# Patient Record
Sex: Female | Born: 1955 | Race: White | Hispanic: No | Marital: Married | Smoking: Never smoker
Health system: Southern US, Community
[De-identification: ages and names within clinical notes are randomized; demographics above are authoritative.]

## PROBLEM LIST (undated history)

## (undated) DIAGNOSIS — I10 Essential (primary) hypertension: Secondary | ICD-10-CM

---

## 2019-01-30 ENCOUNTER — Ambulatory Visit: Payer: Self-pay

## 2019-02-14 ENCOUNTER — Ambulatory Visit: Payer: Self-pay

## 2019-07-16 ENCOUNTER — Observation Stay (HOSPITAL_BASED_OUTPATIENT_CLINIC_OR_DEPARTMENT_OTHER)
Admission: EM | Admit: 2019-07-16 | Discharge: 2019-07-17 | Disposition: A | Discharge status: Home / Self Care | Payer: Medicare PPO | Attending: Internal Medicine | Admitting: Internal Medicine

## 2019-07-16 ENCOUNTER — Encounter (HOSPITAL_BASED_OUTPATIENT_CLINIC_OR_DEPARTMENT_OTHER): Payer: Self-pay

## 2019-07-16 ENCOUNTER — Emergency Department (HOSPITAL_BASED_OUTPATIENT_CLINIC_OR_DEPARTMENT_OTHER): Payer: Medicare PPO

## 2019-07-16 ENCOUNTER — Other Ambulatory Visit: Payer: Self-pay

## 2019-07-16 DIAGNOSIS — R410 Disorientation, unspecified: Secondary | ICD-10-CM

## 2019-07-16 DIAGNOSIS — Z79899 Other long term (current) drug therapy: Secondary | ICD-10-CM | POA: Diagnosis not present

## 2019-07-16 DIAGNOSIS — R29700 NIHSS score 0: Secondary | ICD-10-CM | POA: Insufficient documentation

## 2019-07-16 DIAGNOSIS — R4182 Altered mental status, unspecified: Secondary | ICD-10-CM | POA: Diagnosis present

## 2019-07-16 DIAGNOSIS — R001 Bradycardia, unspecified: Secondary | ICD-10-CM | POA: Diagnosis not present

## 2019-07-16 DIAGNOSIS — Z20822 Contact with and (suspected) exposure to covid-19: Secondary | ICD-10-CM | POA: Diagnosis not present

## 2019-07-16 DIAGNOSIS — I639 Cerebral infarction, unspecified: Secondary | ICD-10-CM | POA: Diagnosis not present

## 2019-07-16 DIAGNOSIS — I10 Essential (primary) hypertension: Secondary | ICD-10-CM | POA: Diagnosis not present

## 2019-07-16 DIAGNOSIS — G454 Transient global amnesia: Secondary | ICD-10-CM | POA: Diagnosis not present

## 2019-07-16 HISTORY — DX: Essential (primary) hypertension: I10

## 2019-07-16 LAB — COMPREHENSIVE METABOLIC PANEL
ALT: 32 U/L (ref 0–44)
AST: 27 U/L (ref 15–41)
Albumin: 4.2 g/dL (ref 3.5–5.0)
Alkaline Phosphatase: 60 U/L (ref 38–126)
Anion gap: 11 (ref 5–15)
BUN: 15 mg/dL (ref 8–23)
CO2: 27 mmol/L (ref 22–32)
Calcium: 9.1 mg/dL (ref 8.9–10.3)
Chloride: 101 mmol/L (ref 98–111)
Creatinine, Ser: 0.84 mg/dL (ref 0.44–1.00)
GFR calc Af Amer: >60 mL/min (ref >60)
GFR calc non Af Amer: >60 mL/min (ref >60)
Glucose, Bld: 100 mg/dL — ABNORMAL HIGH (ref 70–99)
Potassium: 3.6 mmol/L (ref 3.5–5.1)
Sodium: 139 mmol/L (ref 135–145)
Total Bilirubin: 0.8 mg/dL (ref 0.3–1.2)
Total Protein: 6.9 g/dL (ref 6.5–8.1)

## 2019-07-16 LAB — DIFFERENTIAL
Abs Immature Granulocytes: 0.01 10*3/uL (ref 0.00–0.07)
Basophils Absolute: 0 10*3/uL (ref 0.0–0.1)
Basophils Relative: 1 %
Eosinophils Absolute: 0 10*3/uL (ref 0.0–0.5)
Eosinophils Relative: 1 %
Immature Granulocytes: 0 %
Lymphocytes Relative: 28 %
Lymphs Abs: 1 10*3/uL (ref 0.7–4.0)
Monocytes Absolute: 0.3 10*3/uL (ref 0.1–1.0)
Monocytes Relative: 8 %
Neutro Abs: 2.2 10*3/uL (ref 1.7–7.7)
Neutrophils Relative %: 62 %

## 2019-07-16 LAB — SARS CORONAVIRUS 2 BY RT PCR (HOSPITAL ORDER, PERFORMED IN ~~LOC~~ HOSPITAL LAB): SARS Coronavirus 2: NEGATIVE

## 2019-07-16 LAB — CBC
HCT: 45.3 % (ref 36.0–46.0)
Hemoglobin: 14.5 g/dL (ref 12.0–15.0)
MCH: 30 pg (ref 26.0–34.0)
MCHC: 32 g/dL (ref 30.0–36.0)
MCV: 93.6 fL (ref 80.0–100.0)
Platelets: 165 10*3/uL (ref 150–400)
RBC: 4.84 MIL/uL (ref 3.87–5.11)
RDW: 13.3 % (ref 11.5–15.5)
WBC: 3.5 10*3/uL — ABNORMAL LOW (ref 4.0–10.5)
nRBC: 0 % (ref 0.0–0.2)

## 2019-07-16 LAB — URINALYSIS, ROUTINE W REFLEX MICROSCOPIC
Bilirubin Urine: NEGATIVE
Glucose, UA: NEGATIVE mg/dL
Hgb urine dipstick: NEGATIVE
Ketones, ur: NEGATIVE mg/dL
Leukocytes,Ua: NEGATIVE
Nitrite: NEGATIVE
Protein, ur: NEGATIVE mg/dL
Specific Gravity, Urine: <1.005 — ABNORMAL LOW (ref 1.005–1.030)
pH: 6 (ref 5.0–8.0)

## 2019-07-16 LAB — RAPID URINE DRUG SCREEN, HOSP PERFORMED
Amphetamines: NOT DETECTED
Barbiturates: NOT DETECTED
Benzodiazepines: NOT DETECTED
Cocaine: NOT DETECTED
Opiates: NOT DETECTED
Tetrahydrocannabinol: NOT DETECTED

## 2019-07-16 LAB — PROTIME-INR
INR: 1 (ref 0.8–1.2)
Prothrombin Time: 12.5 seconds (ref 11.4–15.2)

## 2019-07-16 LAB — APTT: aPTT: 26 seconds (ref 24–36)

## 2019-07-16 LAB — ETHANOL: Alcohol, Ethyl (B): <10 mg/dL (ref <10)

## 2019-07-16 IMAGING — CT CT HEAD W/O CM
3 series · 15 of 45 positions shown, 18 images · non-contrast
Comparison: None.

CLINICAL DATA: Confusion

EXAM:
CT HEAD WITHOUT CONTRAST
TECHNIQUE: Contiguous axial images were obtained from the base of the skull
through the vertex without intravenous contrast.

[Series 3: head wo · axial · 0.42mm/px · z∈[-174,-59]mm · 9 of 28 slices shown, 12 images]
[im 3/28  brain]
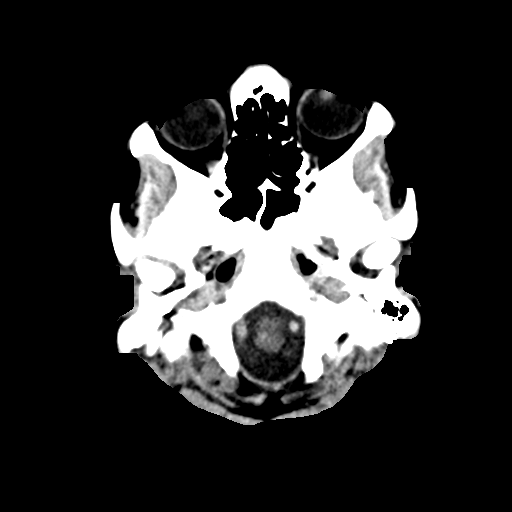
[im 3/28  bone]
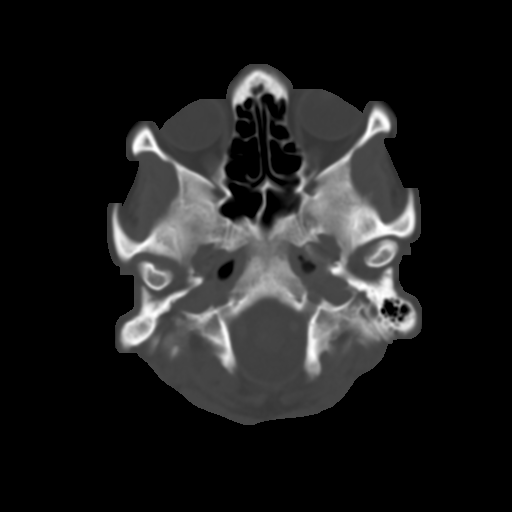
[im 6/28  brain]
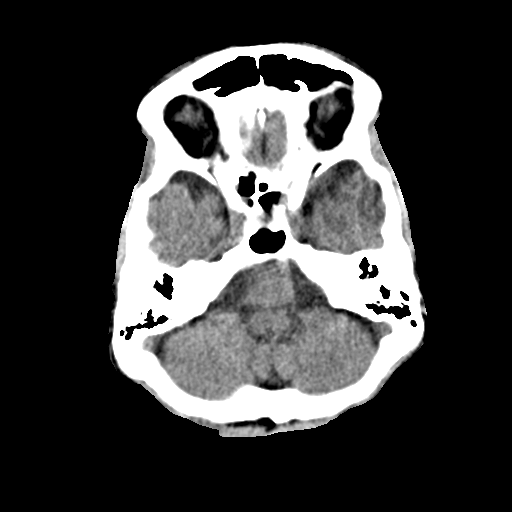
[im 9/28  brain]
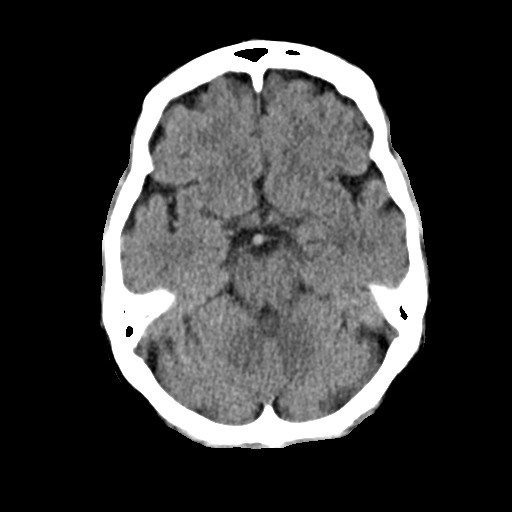
[im 12/28  brain]
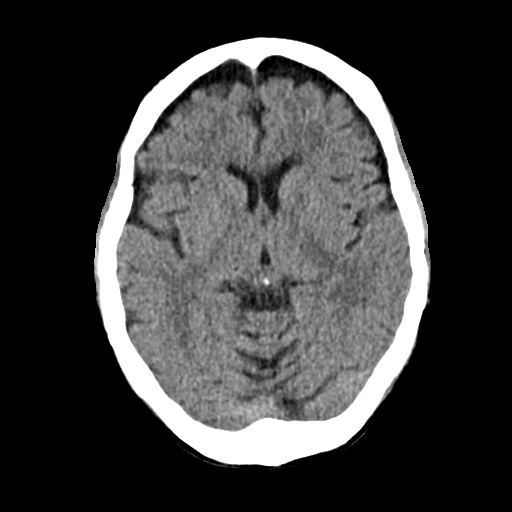
[im 15/28  brain]
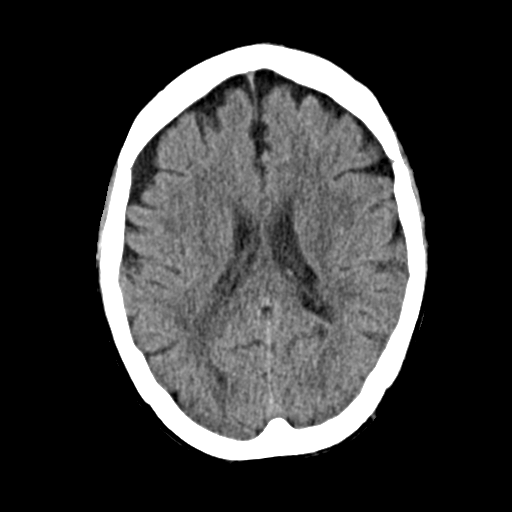
[im 15/28  bone]
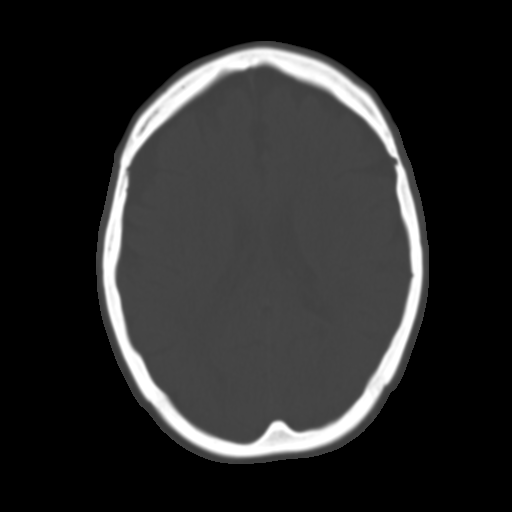
[im 17/28  brain]
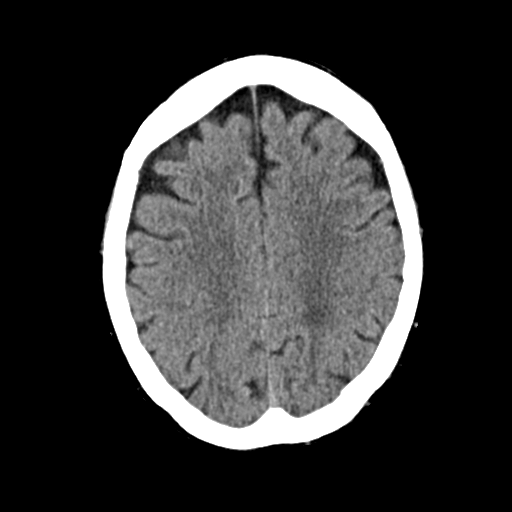
[im 20/28  brain]
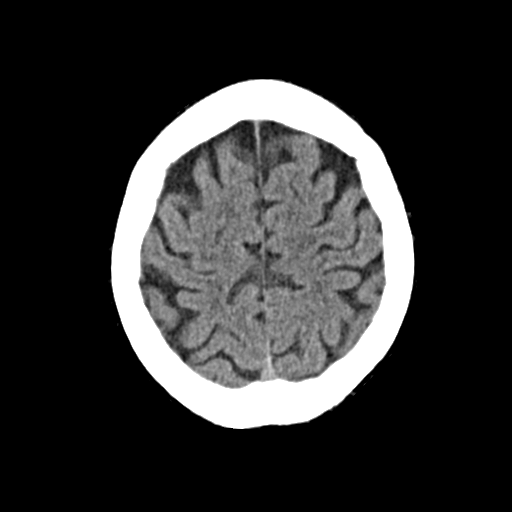
[im 23/28  brain]
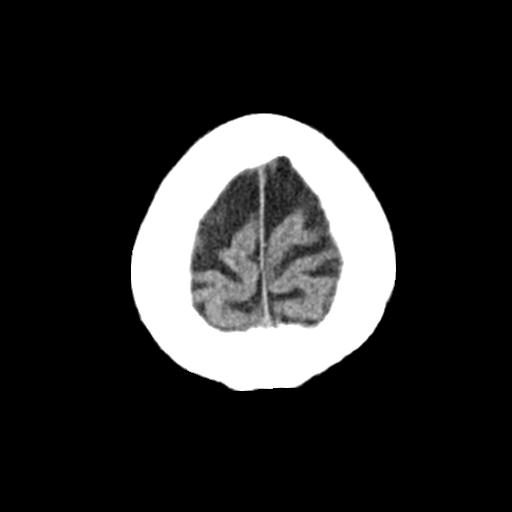
[im 26/28  brain]
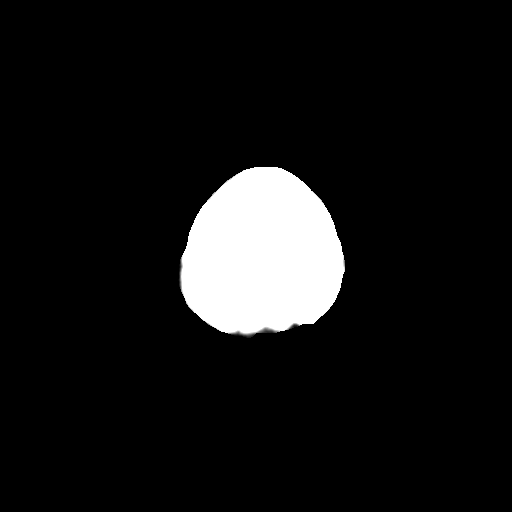
[im 26/28  bone]
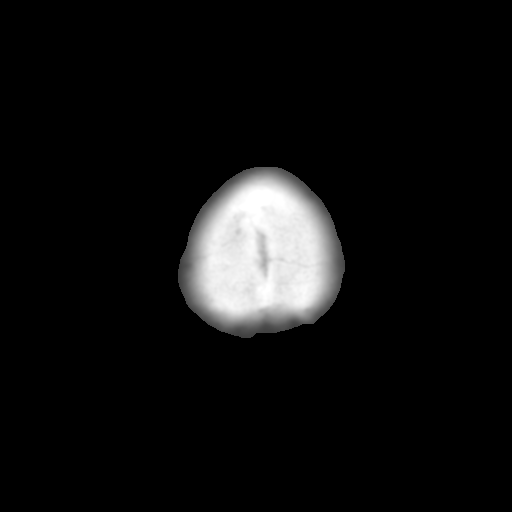

[Series 4: coronal soft · coronal · 0.29mm/px · 3 of 59 slices shown]
[im 20/59  brain]
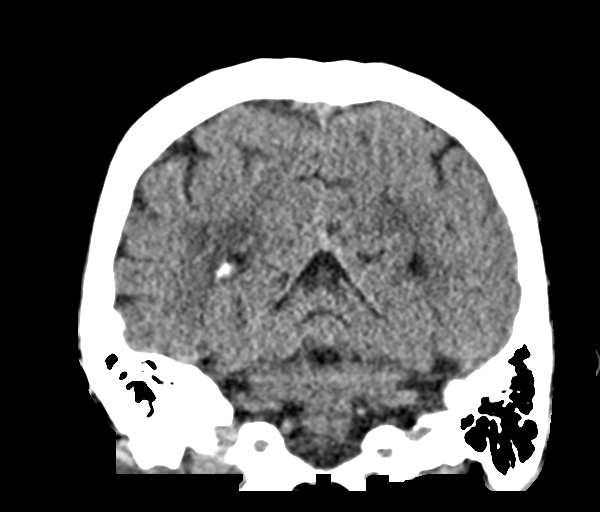
[im 26/59  brain]
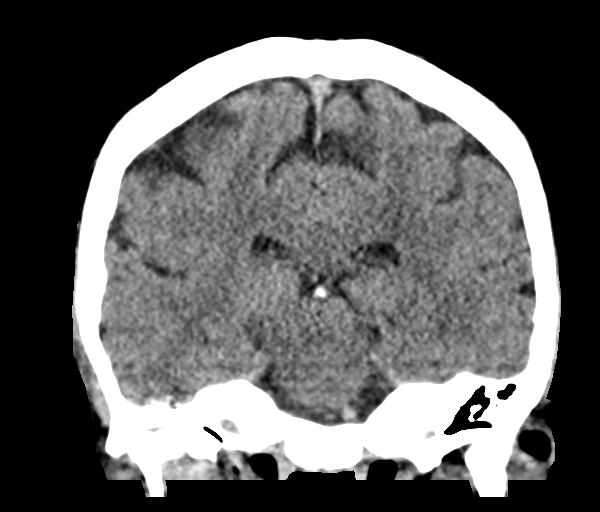
[im 33/59  brain]
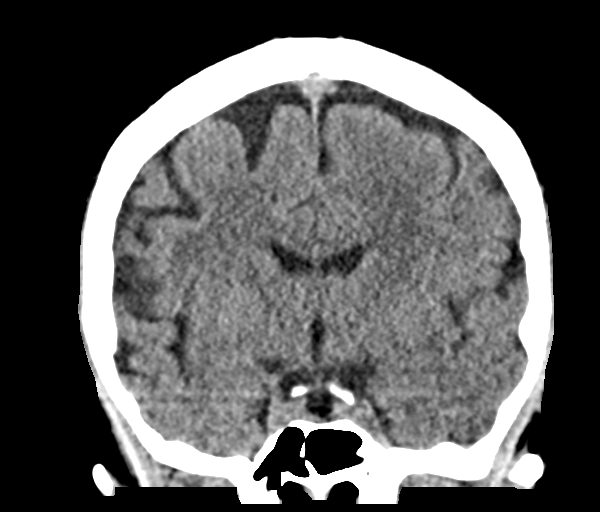

[Series 5: sag soft · sagittal · 0.28mm/px · 3 of 48 slices shown]
[im 16/48  brain]
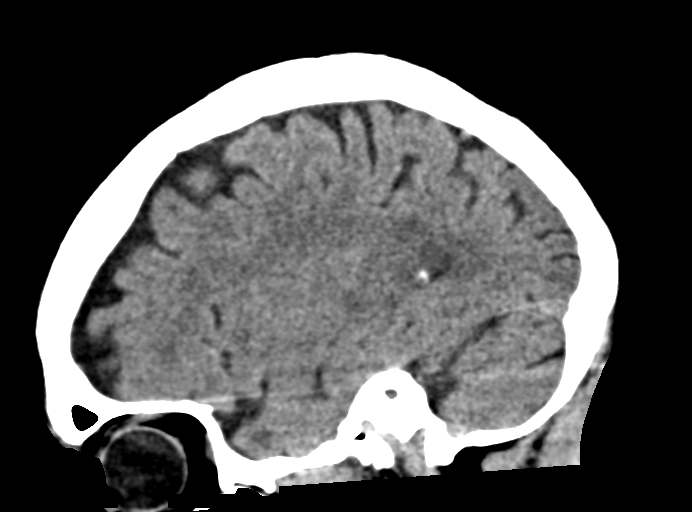
[im 24/48  brain]
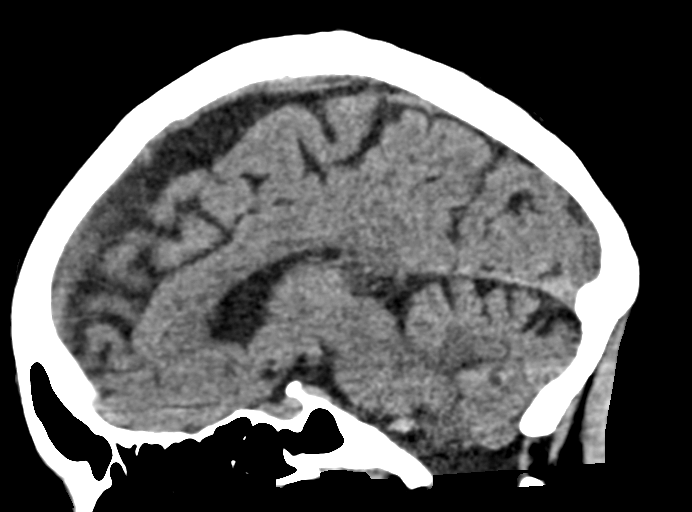
[im 32/48  brain]
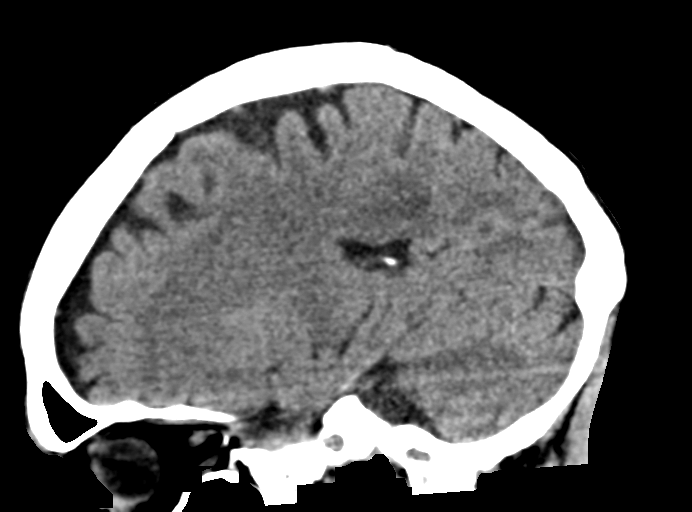

[15 of 45 positions shown; findings below may reference images not displayed]

FINDINGS: Brain: Mild diffuse atrophy. No evidence of acute infarction,
hemorrhage, hydrocephalus, extra-axial collection or mass
lesion/mass effect.

Vascular: No hyperdense vessel or unexpected calcification.

Skull: Normal. Negative for fracture or focal lesion.

Sinuses/Orbits: No acute finding.

Other: None.
IMPRESSION: 1. Negative for bleed or other acute intracranial process.
2. Mild diffuse atrophy.

## 2019-07-16 MED ORDER — SODIUM CHLORIDE 0.9 % IV BOLUS
500.0000 mL | Freq: Once | INTRAVENOUS | Status: AC
  Administered 2019-07-16: 500 mL via INTRAVENOUS

## 2019-07-16 MED ORDER — SODIUM CHLORIDE 0.9 % IV SOLN
100.0000 mL/h | INTRAVENOUS | Status: DC
  Administered 2019-07-16: 100 mL/h via INTRAVENOUS

## 2019-07-16 NOTE — ED Notes (Signed)
Pt and husband had food delivered to ED; food brought to pt's room by EMT

## 2019-07-16 NOTE — ED Notes (Signed)
Graham crackers and Diet Coke provided to pt

## 2019-07-16 NOTE — ED Notes (Signed)
ED Provider at bedside. 

## 2019-07-16 NOTE — ED Notes (Signed)
Unable to draw blood with IV start.

## 2019-07-16 NOTE — ED Notes (Signed)
Patient transported to CT 

## 2019-07-16 NOTE — ED Triage Notes (Signed)
Pt arrives with husband who reports that patient woke up this morning around 6 am, went through her normal routine and then had a sudden onset of confusion. Per husband she did not remember laying some clothes out, or remember placing  certain things around the room. He states she seemed to be taking longer to process things when he was speaking with her. Denies any gait issues, slurred speech, numbness, headaches, or vision changes.

## 2019-07-16 NOTE — Consult Note (Signed)
[facility] Tele[facility] Consult Services  Stat Consult  Date of Service:   07/16/2019 09:23:38  Impression:       G45.4 - Transient global amnesia  Comments/Sign-Out: Amnestic/confusional spell. Suspect transient global amnesia. Ddx includes stroke, seizure, TIA. Recommend MRI brain, MRA head and neck, TTE, EEG. ASA 81 mg recommended.  CT HEAD: Showed No Acute Hemorrhage or Acute Core Infarct Reviewed  Metrics: [facility] Notification Time: 07/16/2019 09:18:32 Stamp Time: 07/16/2019 09:23:38 Callback Response Time: 07/16/2019 09:28:10  Our recommendations are outlined below.  Recommendations:       Initiate Aspirin 81 MG Daily EEG, MRI brain, MRA head and neck.   Imaging Studies:       MRI Head with and Without Contrast       MRA Head and Neck Without Contrast When Available - Stroke Protocol  Disposition: Neurology Follow Up Recommended  Sign Out:       Discussed with Emergency Department Provider  ----------------------------------------------------------------------------------------------------  Chief Complaint: confusion  History of Present Illness: Patient is a 64 year old Female.  The patient awoke feeling normal. She was able to exercise on the eliptical without issue. However afterwards she did not remember things she did this morning and was confused about where she placed things. She did not remember busying shoes three days ago. She had no speech changes, though her husband states that she was "staring". She has had resolution of symptoms. Total duration 15-20 mins. She has not had prior similar episodes. No history of seizures. She denies new medications.   Past Medical History:      Hypertension      There is NO history of Diabetes Mellitus      There is NO history of Hyperlipidemia      There is NO history of Atrial Fibrillation      There is NO history of Coronary Artery Disease      There is NO history of  Stroke  Anticoagulant use:  No  Antiplatelet use: No    Examination: BP(133/86), Pulse(50), Blood Glucose(pending) 1A: Level of Consciousness - Alert; keenly responsive + 0 1B: Ask Month and Age - Both Questions Right + 0 1C: Blink Eyes & Squeeze Hands - Performs Both Tasks + 0 2: Test Horizontal Extraocular Movements - Normal + 0 3: Test Visual Fields - No Visual Loss + 0 4: Test Facial Palsy (Use Grimace if Obtunded) - Normal symmetry + 0 5A: Test Left Arm Motor Drift - No Drift for 10 Seconds + 0 5B: Test Right Arm Motor Drift - No Drift for 10 Seconds + 0 6A: Test Left Leg Motor Drift - No Drift for 5 Seconds + 0 6B: Test Right Leg Motor Drift - No Drift for 5 Seconds + 0 7: Test Limb Ataxia (FNF/Heel-Shin) - No Ataxia + 0 8: Test Sensation - Normal; No sensory loss + 0 9: Test Language/Aphasia - Normal; No aphasia + 0 10: Test Dysarthria - Normal + 0 11: Test Extinction/Inattention - No abnormality + 0  NIHSS Score: 0    Patient is being evaluated for possible acute neurologic impairment and high probability of imminent or life-threatening deterioration. I spent total of 20 minutes providing care to this patient, including time for face to face visit via telemedicine, review of medical records, imaging studies and discussion of findings with providers, the patient and/or family.   Dr Rutherford Guys   [facility] [phone]  Case [id number]

## 2019-07-16 NOTE — ED Notes (Signed)
Pt ambulated around unit w/o difficulty.

## 2019-07-16 NOTE — ED Notes (Signed)
Per husband, pt is back to baseline upon arrival to ED.

## 2019-07-16 NOTE — ED Provider Notes (Signed)
[facility] EMERGENCY DEPARTMENT Provider Note   CSN: [id number] Arrival date & time: 07/16/19  0846     History Chief Complaint  Patient presents with  . Altered Mental Status    Brianna Mccoy is a 64 y.o. female.  HPI   Pt presents to the ED for evaluation of acute confusion.  History was provided by the patient as well as her husband.  He states she woke up this morning her usual self.  Patient typically exercises in the morning and she had set out her clothing.  Husband states the patient then called him up to the room.  She asked him who put her clothing out.  The husband stated that he told her that she had done it but the patient did not remember that.  She then asked him about some shoes and asked to use those were.  Husband stated those were her shoes.  She did not believe him and asked him if he had a receipt.  The patient had a few more statements similar to that where she did not recognize items that were hers.  This lasted about 10 to 15 minutes.  Husband obviously started to become concerned.  Patient does not remember that episode right now but she is feeling better.  Husband states she does seem to be acting more appropriately now.  She never had any issues with headache.  She did not have any trouble with numbness or weakness or trouble with her vision.  Her speech was clear at all times.  Past Medical History:  Diagnosis Date  . Hypertension     There are no problems to display for this patient.   History reviewed. No pertinent surgical history.   OB History   No obstetric history on file.     No family history on file.  Social History   Tobacco Use  . Smoking status: Never Smoker  . Smokeless tobacco: Never Used  Substance Use Topics  . Alcohol use: Yes    Comment: socail  . Drug use: Never  Patient does exercise regularly  Home Medications Prior to Admission medications   Medication Sig Start Date End Date Taking? Authorizing Provider   estradiol-norethindrone (ACTIVELLA) 1-0.5 MG tablet Take 1 tablet by mouth daily. 08/07/16  Yes [provider]  polyethylene glycol powder (GLYCOLAX/MIRALAX) 17 GM/SCOOP powder every other day. 01/05/18  Yes [provider]  triamterene-hydrochlorothiazide (MAXZIDE-25) 37.5-25 MG tablet Take 1 tablet by mouth daily. 08/07/16  Yes [provider]  Calcium Carbonate-Vitamin D 600-200 MG-UNIT TABS Take 1 tablet by mouth daily.    [provider]  triamterene-hydrochlorothiazide (MAXZIDE-25) 37.5-25 MG tablet Take 1 tablet by mouth daily. 05/05/19   [provider]    Allergies    Patient has no known allergies.  Review of Systems   Review of Systems  All other systems reviewed and are negative.   Physical Exam Updated Vital Signs BP 133/86 (BP Location: Right Arm)   Pulse (!) 50   Temp 98.2 F (36.8 C) (Oral)   Resp 20   Ht 1.626 m (5\' 4" )   Wt 48.5 kg   SpO2 100%   BMI 18.37 kg/m   Physical Exam Vitals and nursing note reviewed.  Constitutional:      General: She is not in acute distress.    Appearance: She is well-developed and underweight.  HENT:     Head: Normocephalic and atraumatic.     Right Ear: External ear normal.  Left Ear: External ear normal.  Eyes:     General: No scleral icterus.       Right eye: No discharge.        Left eye: No discharge.     Conjunctiva/sclera: Conjunctivae normal.  Neck:     Trachea: No tracheal deviation.  Cardiovascular:     Rate and Rhythm: Normal rate and regular rhythm.  Pulmonary:     Effort: Pulmonary effort is normal. No respiratory distress.     Breath sounds: Normal breath sounds. No stridor. No wheezing or rales.  Abdominal:     General: Bowel sounds are normal. There is no distension.     Palpations: Abdomen is soft.     Tenderness: There is no abdominal tenderness. There is no guarding or rebound.  Musculoskeletal:        General: No tenderness.     Cervical back: Neck  supple.  Skin:    General: Skin is warm and dry.     Findings: No rash.  Neurological:     Mental Status: She is alert and oriented to person, place, and time.     Cranial Nerves: No cranial nerve deficit (No facial droop, extraocular movements intact, tongue midline ).     Sensory: No sensory deficit.     Motor: No abnormal muscle tone or seizure activity.     Coordination: Coordination normal.     Comments: No pronator drift bilateral upper extrem, able to hold both legs off bed for 5 seconds, sensation intact in all extremities, no visual field cuts, no left or right sided neglect, normal finger-nose exam bilaterally, no nystagmus noted  Patient does know the date, her birthdate and her location.  She knows this is a medical facility but she said this was [facility] [address].     ED Results / Procedures / Treatments   Labs (all labs ordered are listed, but only abnormal results are displayed) Labs Reviewed  CBC - Abnormal; Notable for the following components:      Result Value   WBC 3.5 (*)    All other components within normal limits  COMPREHENSIVE METABOLIC PANEL - Abnormal; Notable for the following components:   Glucose, Bld 100 (*)    All other components within normal limits  URINALYSIS, ROUTINE W REFLEX MICROSCOPIC - Abnormal; Notable for the following components:   Specific Gravity, Urine <1.005 (*)    All other components within normal limits  ETHANOL  PROTIME-INR  APTT  DIFFERENTIAL  RAPID URINE DRUG SCREEN, HOSP PERFORMED    EKG EKG Interpretation  Date/Time:  Sunday July 16 2019 10:39:44 EDT Ventricular Rate:  52 PR Interval:    QRS Duration: 89 QT Interval:  463 QTC Calculation: 431 R Axis:   100 Text Interpretation: Sinus rhythm Right axis deviation No old tracing to compare Confirmed by Linwood Dibbles [phone]) on 07/16/2019 10:52:09 AM   Radiology CT HEAD WO CONTRAST  Result Date: 07/16/2019 CLINICAL DATA:  Confusion EXAM: CT HEAD  WITHOUT CONTRAST TECHNIQUE: Contiguous axial images were obtained from the base of the skull through the vertex without intravenous contrast. COMPARISON:  None. FINDINGS: Brain: Mild diffuse atrophy. No evidence of acute infarction, hemorrhage, hydrocephalus, extra-axial collection or mass lesion/mass effect. Vascular: No hyperdense vessel or unexpected calcification. Skull: Normal. Negative for fracture or focal lesion. Sinuses/Orbits: No acute finding. Other: None. IMPRESSION: 1. Negative for bleed or other acute intracranial process. 2. Mild diffuse atrophy. Electronically Signed   By: Corlis Leak  M.D.   On: 07/16/2019 09:28    Procedures Procedures (including critical care time)  Medications Ordered in ED Medications  sodium chloride 0.9 % bolus 500 mL (500 mLs Intravenous New Bag/Given 07/16/19 1045)    Followed by  0.9 %  sodium chloride infusion (has no administration in time range)    ED Course  I have reviewed the triage vital signs and the nursing notes.  Pertinent labs & imaging results that were available during my care of the patient were reviewed by me and considered in my medical decision making (see chart for details).  Clinical Course as of Jul 15 1052  Sun Jul 16, 2019  0100 Case discussed with Dr. Ether Griffins, teleneurology.  Agrees most likely transient global amnesia but seizure is also a possibility.  Would recommend admission for further work-up including MRI and EEG.   [JK]  1054 Labs reviewed.  UA unremarkable.  Electrolyte panel unremarkable.  CBC shows white blood cell count slightly lower than normal but I doubt clinically significant.   [JK]  1054 CT scan of the brain does show mild diffuse atrophy   [JK]    Clinical Course User Index [JK] Linwood Dibbles, MD   MDM Rules/Calculators/A&P               NIH Stroke Scale: 0           Patient presented with acute onset of amnesia.  ED work-up reassuring.  NIH stroke scale equals 0.  Presentation concerning for the  possibility of transient global amnesia.  Teleneurology consultation was obtained.  Possibility of a seizure was also raised.  Recommendation was for admission, MRI and EEG.  I will consult the medical service at [facility] to arrange for further evaluation. Final Clinical Impression(s) / ED Diagnoses Final diagnoses:  Transient global amnesia      Linwood Dibbles, MD 07/16/19 1056

## 2019-07-16 NOTE — ED Notes (Signed)
Tele Stroke Cart at Bedside

## 2019-07-17 ENCOUNTER — Observation Stay (HOSPITAL_COMMUNITY): Payer: Medicare PPO

## 2019-07-17 ENCOUNTER — Encounter (HOSPITAL_COMMUNITY): Payer: Self-pay | Admitting: Internal Medicine

## 2019-07-17 ENCOUNTER — Observation Stay (HOSPITAL_BASED_OUTPATIENT_CLINIC_OR_DEPARTMENT_OTHER): Payer: Medicare PPO

## 2019-07-17 DIAGNOSIS — I351 Nonrheumatic aortic (valve) insufficiency: Secondary | ICD-10-CM

## 2019-07-17 DIAGNOSIS — I340 Nonrheumatic mitral (valve) insufficiency: Secondary | ICD-10-CM | POA: Diagnosis not present

## 2019-07-17 DIAGNOSIS — I10 Essential (primary) hypertension: Secondary | ICD-10-CM | POA: Diagnosis not present

## 2019-07-17 DIAGNOSIS — G454 Transient global amnesia: Secondary | ICD-10-CM

## 2019-07-17 DIAGNOSIS — I361 Nonrheumatic tricuspid (valve) insufficiency: Secondary | ICD-10-CM

## 2019-07-17 DIAGNOSIS — R569 Unspecified convulsions: Secondary | ICD-10-CM | POA: Diagnosis not present

## 2019-07-17 LAB — CBC
HCT: 41.4 % (ref 36.0–46.0)
Hemoglobin: 13.3 g/dL (ref 12.0–15.0)
MCH: 30.4 pg (ref 26.0–34.0)
MCHC: 32.1 g/dL (ref 30.0–36.0)
MCV: 94.7 fL (ref 80.0–100.0)
Platelets: 152 10*3/uL (ref 150–400)
RBC: 4.37 MIL/uL (ref 3.87–5.11)
RDW: 13.2 % (ref 11.5–15.5)
WBC: 4 10*3/uL (ref 4.0–10.5)
nRBC: 0 % (ref 0.0–0.2)

## 2019-07-17 LAB — LIPID PANEL
Cholesterol: 194 mg/dL (ref 0–200)
HDL: 90 mg/dL (ref >40)
LDL Cholesterol: 92 mg/dL (ref 0–99)
Total CHOL/HDL Ratio: 2.2 RATIO
Triglycerides: 61 mg/dL (ref <150)
VLDL: 12 mg/dL (ref 0–40)

## 2019-07-17 LAB — CREATININE, SERUM
Creatinine, Ser: 0.95 mg/dL (ref 0.44–1.00)
GFR calc Af Amer: >60 mL/min (ref >60)
GFR calc non Af Amer: >60 mL/min (ref >60)

## 2019-07-17 LAB — ECHOCARDIOGRAM COMPLETE
Height: 64 in
Weight: 1703.71 oz

## 2019-07-17 LAB — BASIC METABOLIC PANEL
Anion gap: 11 (ref 5–15)
BUN: 9 mg/dL (ref 8–23)
CO2: 24 mmol/L (ref 22–32)
Calcium: 9.3 mg/dL (ref 8.9–10.3)
Chloride: 106 mmol/L (ref 98–111)
Creatinine, Ser: 0.85 mg/dL (ref 0.44–1.00)
GFR calc Af Amer: >60 mL/min (ref >60)
GFR calc non Af Amer: >60 mL/min (ref >60)
Glucose, Bld: 93 mg/dL (ref 70–99)
Potassium: 3.8 mmol/L (ref 3.5–5.1)
Sodium: 141 mmol/L (ref 135–145)

## 2019-07-17 LAB — TSH: TSH: 5.414 u[IU]/mL — ABNORMAL HIGH (ref 0.350–4.500)

## 2019-07-17 LAB — HEMOGLOBIN A1C
Hgb A1c MFr Bld: 5.3 % (ref 4.8–5.6)
Mean Plasma Glucose: 105.41 mg/dL

## 2019-07-17 LAB — HIV ANTIBODY (ROUTINE TESTING W REFLEX): HIV Screen 4th Generation wRfx: NONREACTIVE

## 2019-07-17 IMAGING — MR MR HEAD WO/W CM
14 of 16 series · 40 of 48 positions shown · IV contrast (gadavist)
Comparison: Head CT [DATE]

CLINICAL DATA: Acute confusion.

EXAM:
MRI HEAD WITHOUT AND WITH CONTRAST
MRA HEAD WITHOUT CONTRAST
MRA NECK WITHOUT CONTRAST
TECHNIQUE: Multiplanar, multiecho pulse sequences of the brain and surrounding
structures were obtained without and with intravenous contrast.
Angiographic images of the Circle of Willis were obtained using MRA
technique without intravenous contrast. Angiographic images of the
neck were obtained using MRA technique without intravenous contrast.
Carotid stenosis measurements (when applicable) are obtained
utilizing NASCET criteria, using the distal internal carotid
diameter as the denominator.
CONTRAST:  5mL GADAVIST GADOBUTROL 1 MMOL/ML IV SOLN

[Series 5: DWI · axial · 3.0mm · 0.88mm/px · z∈[-91,+49]mm · 6 of 96 slices shown (1 of 4)]
[im 1/96]
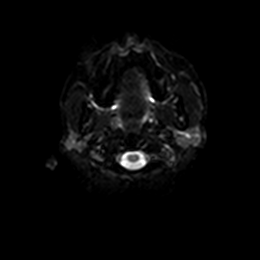
[im 20/96]
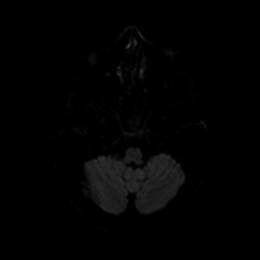
[im 39/96]
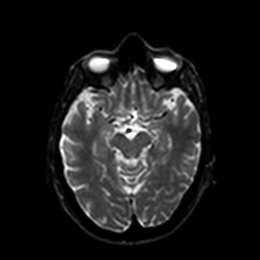
[im 58/96]
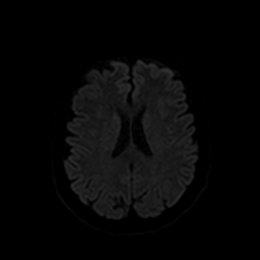
[im 77/96]
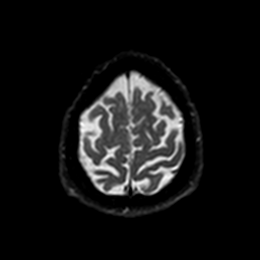
[im 96/96]
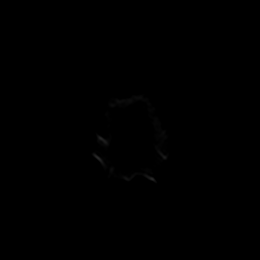

[Series 6: DWI · axial · 3.0mm · 0.88mm/px · z∈[-91,+49]mm · 2 of 48 slices shown (2 of 4)]
[im 1/48]
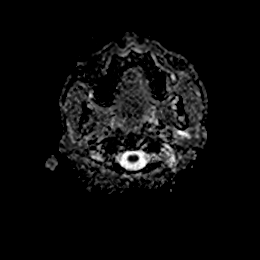
[im 48/48]
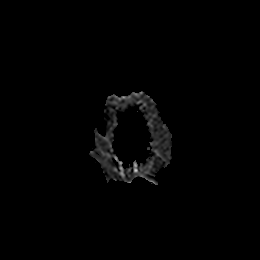

[Series 7: T1 · sagittal · 5.0mm · 0.69mm/px · 1 of 25 slices shown]
[im 1/25]
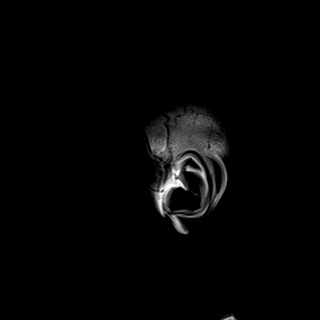

[Series 8: DWI · coronal · 4.0mm · 0.88mm/px · 4 of 64 slices shown (3 of 4)]
[im 1/64]
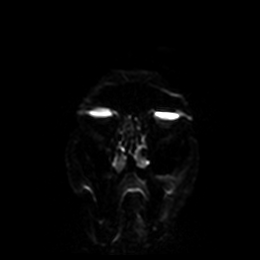
[im 22/64]
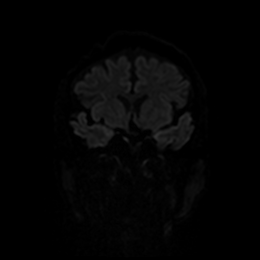
[im 43/64]
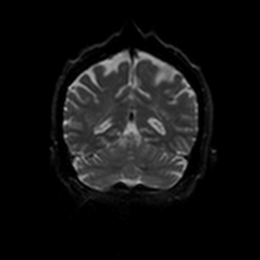
[im 64/64]
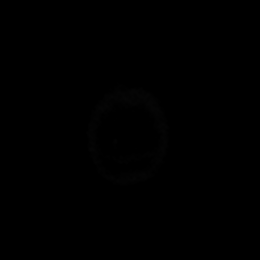

[Series 9: DWI · coronal · 4.0mm · 0.88mm/px · 2 of 30 slices shown (4 of 4)]
[im 1/30]
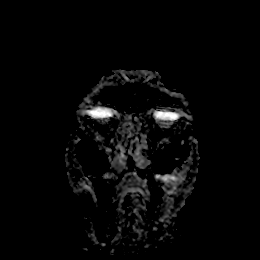
[im 30/30]
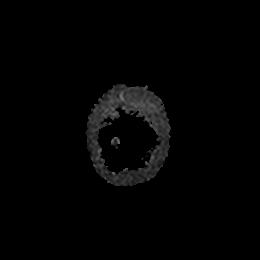

[Series 10: T2 · axial · 5.0mm · 0.72mm/px · z∈[-92,+51]mm · 2 of 25 slices shown]
[im 1/25]
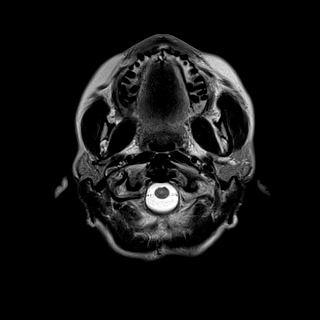
[im 25/25]
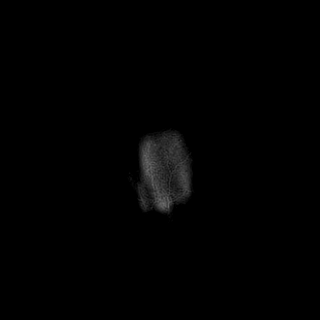

[Series 11: FLAIR · axial · 5.0mm · 0.45mm/px · z∈[-90,+53]mm · 2 of 25 slices shown]
[im 1/25]
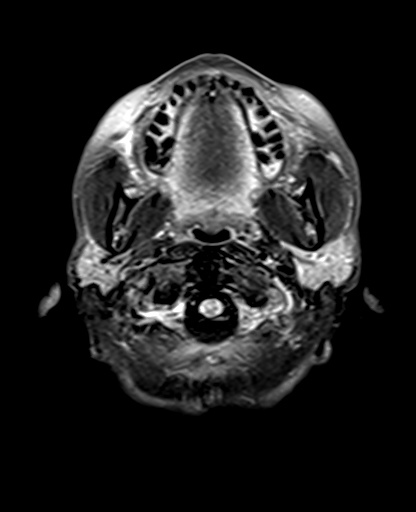
[im 25/25]
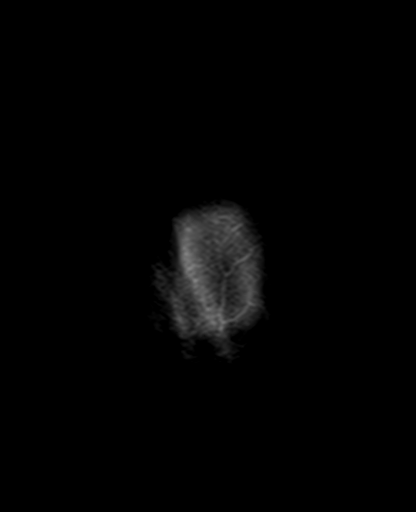

[Series 12: mag_images · axial · 3.0mm · 0.90mm/px · z∈[-94,+58]mm · 4 of 52 slices shown]
[im 1/52]
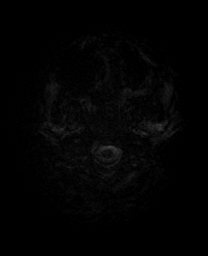
[im 18/52]
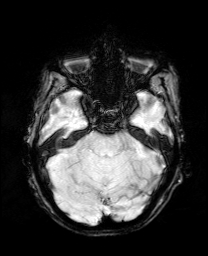
[im 35/52]
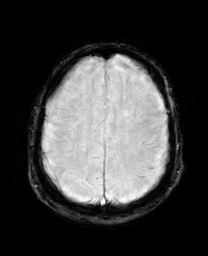
[im 52/52]
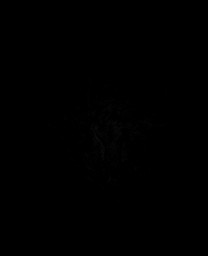

[Series 13: pha_images · axial · 3.0mm · 0.90mm/px · z∈[-94,+55]mm · 4 of 51 slices shown]
[im 1/51]
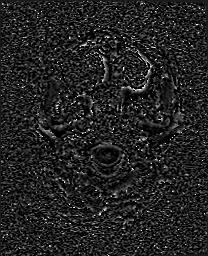
[im 17/51]
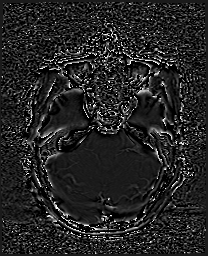
[im 34/51]
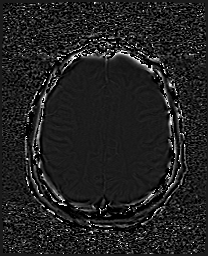
[im 51/51]
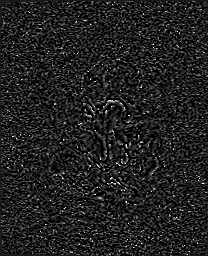

[Series 14: swi_images · axial · 3.0mm · 0.90mm/px · z∈[-94,+58]mm · 4 of 52 slices shown]
[im 1/52]
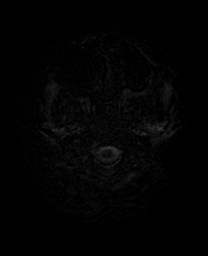
[im 18/52]
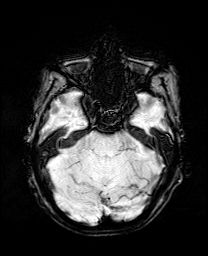
[im 35/52]
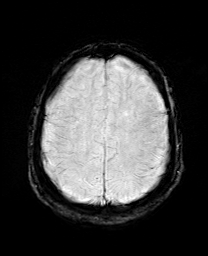
[im 52/52]
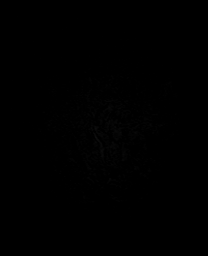

[Series 15: mip_images(sw) · axial · 24.0mm · 0.90mm/px · z∈[-84,+47]mm · 3 of 45 slices shown]
[im 1/45]
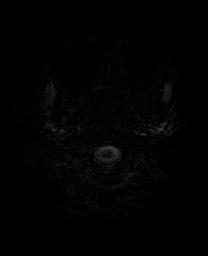
[im 23/45]
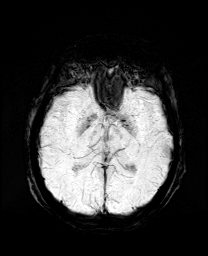
[im 45/45]
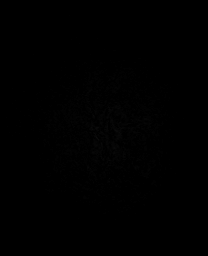

[Series 18: T2 post-contrast · coronal · 5.0mm · 0.72mm/px · 2 of 28 slices shown]
[im 1/28]
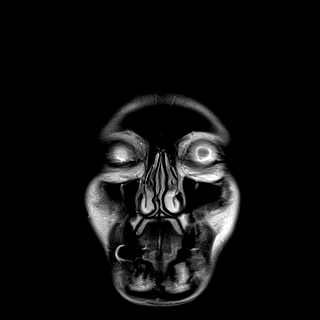
[im 28/28]
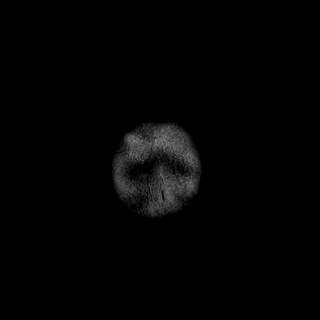

[Series 20: T1 post-contrast · coronal · 5.0mm · 0.34mm/px · 2 of 28 slices shown (1 of 2)]
[im 1/28]
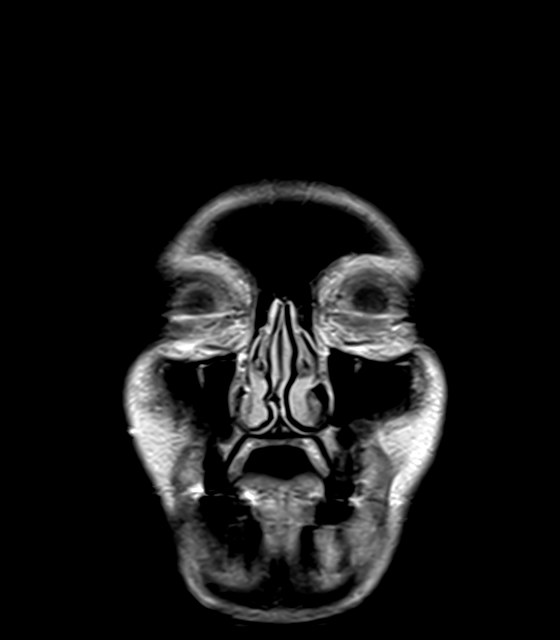
[im 28/28]
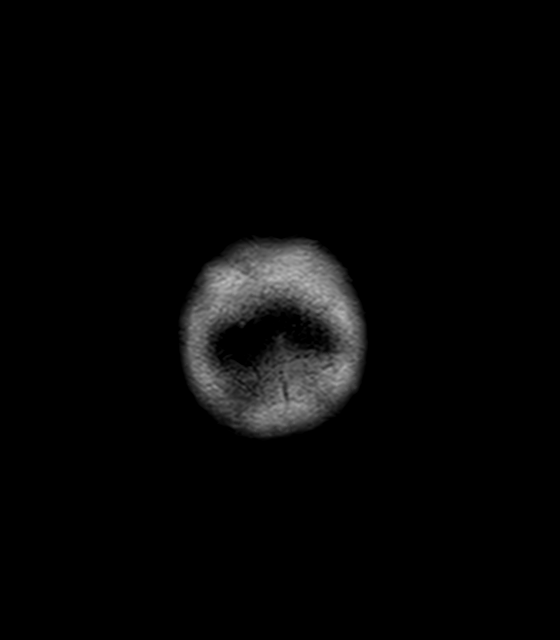

[Series 21: T1 post-contrast · sagittal · 5.0mm · 0.69mm/px · 2 of 25 slices shown (2 of 2)]
[im 1/25]
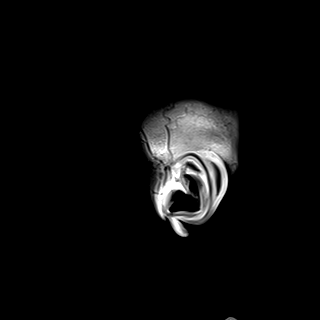
[im 25/25]
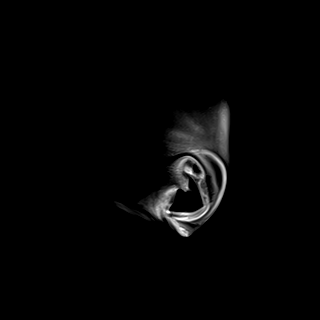

[40 of 48 positions shown; findings below may reference images not displayed]

FINDINGS: MRI HEAD FINDINGS

Brain: There is no evidence of acute infarct, intracranial
hemorrhage, mass, midline shift, or extra-axial fluid collection.
Mild cerebral atrophy is within normal limits for age. Patchy T2
hyperintensities in the cerebral white matter bilaterally are
nonspecific but compatible with mild-to-moderate chronic small
vessel ischemic disease. No abnormal enhancement is identified.

Vascular: Major intracranial vascular flow voids are preserved.

Skull and upper cervical spine: Unremarkable bone marrow signal.

Sinuses/Orbits: Right cataract extraction. Paranasal sinuses and
mastoid air cells are clear.

Other: None.

MRA HEAD FINDINGS

The visualized distal vertebral arteries are patent to the basilar
with the right being moderately dominant. Patent PICAs, AICAs, and
SCAs are seen bilaterally. The basilar artery is widely patent.
There is a small right posterior communicating artery. Both PCAs are
patent without evidence of a significant proximal stenosis.

The internal carotid arteries are widely patent from skull base to
carotid termini. ACAs and MCAs are patent without evidence of a
proximal branch occlusion or significant proximal stenosis. There is
a 1.5 mm left ACA A2 segment aneurysm.

MRA NECK FINDINGS

The aortic arch and left common carotid artery origin were not
imaged. The included portions of the common carotid arteries are
widely patent without evidence of stenosis or dissection. Both
internal carotid arteries are patent with flow artifact in the
carotid bulbs but no suspected significant stenosis or evidence of
dissection.

The vertebral arteries are patent with antegrade flow bilaterally.
The right vertebral artery is dominant. There is no evidence of a
significant vertebral artery stenosis or dissection.
IMPRESSION: 1. No acute intracranial abnormality.
2. Mild-to-moderate chronic small vessel ischemic disease.
3. No major intracranial arterial occlusion or significant proximal
stenosis.
4. 1.5 mm left ACA A2 segment aneurysm.
5. Negative neck MRA.

## 2019-07-17 IMAGING — MR MR MRA NECK W/O CM
7 of 8 series · 46 of 48 positions shown · IV contrast (gadavist)
Comparison: Head CT [DATE]

CLINICAL DATA: Acute confusion.

EXAM:
MRI HEAD WITHOUT AND WITH CONTRAST
MRA HEAD WITHOUT CONTRAST
MRA NECK WITHOUT CONTRAST
TECHNIQUE: Multiplanar, multiecho pulse sequences of the brain and surrounding
structures were obtained without and with intravenous contrast.
Angiographic images of the Circle of Willis were obtained using MRA
technique without intravenous contrast. Angiographic images of the
neck were obtained using MRA technique without intravenous contrast.
Carotid stenosis measurements (when applicable) are obtained
utilizing NASCET criteria, using the distal internal carotid
diameter as the denominator.
CONTRAST:  5mL GADAVIST GADOBUTROL 1 MMOL/ML IV SOLN

[Series 10: tof_fl3d_tra_iso · axial · 0.6mm · 0.52mm/px · z∈[-179,-100]mm · 11 of 133 slices shown]
[im 1/133]
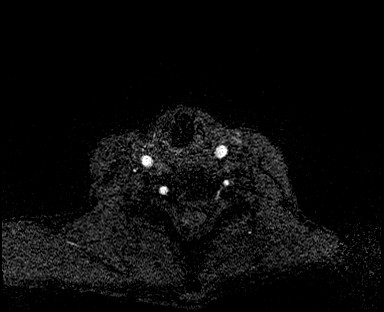
[im 14/133]
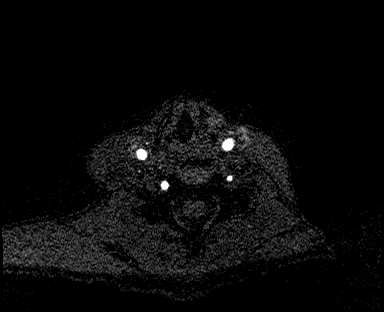
[im 27/133]
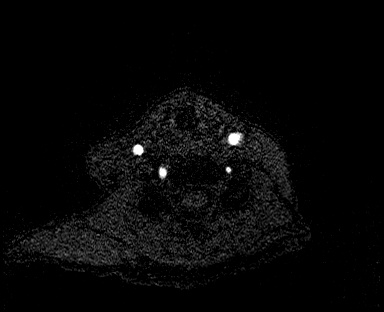
[im 40/133]
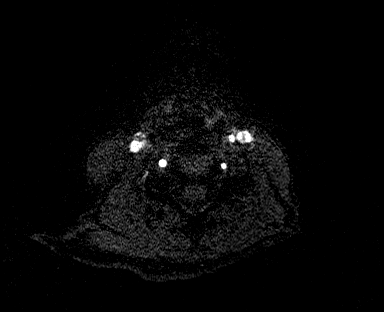
[im 53/133]
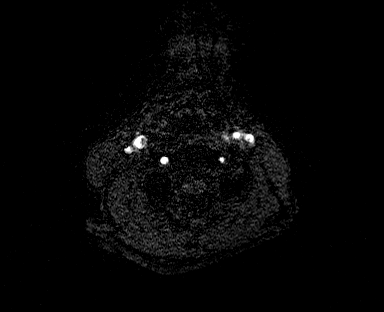
[im 67/133]
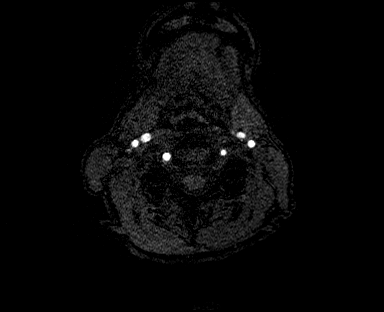
[im 80/133]
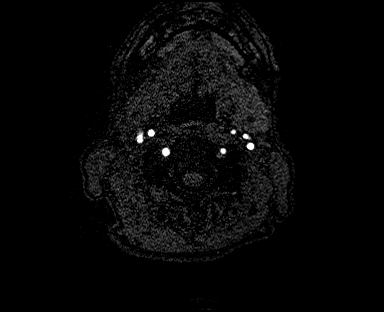
[im 93/133]
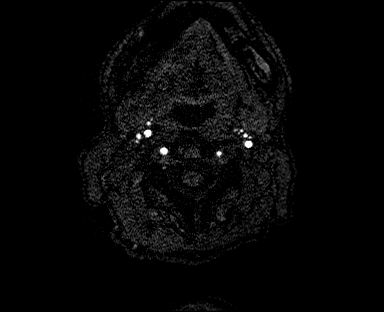
[im 106/133]
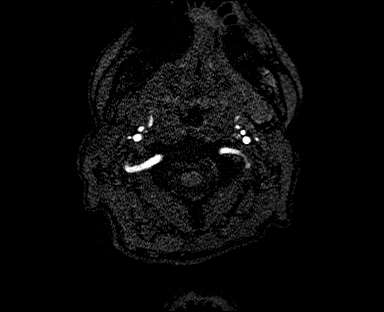
[im 119/133]
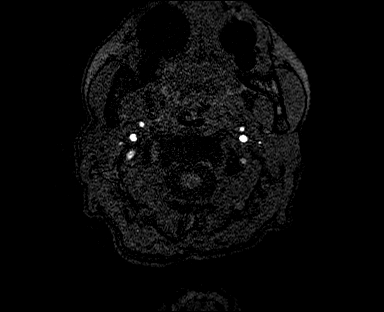
[im 133/133]
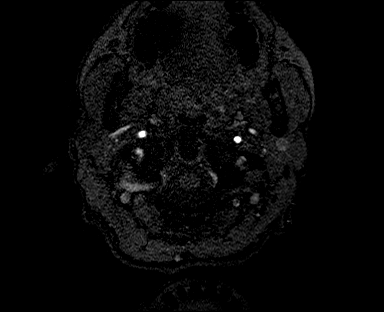

[Series 18: tof_fl2d_tra · axial · 3.0mm · 0.78mm/px · z∈[-276,-132]mm · 7 of 90 slices shown]
[im 1/90]
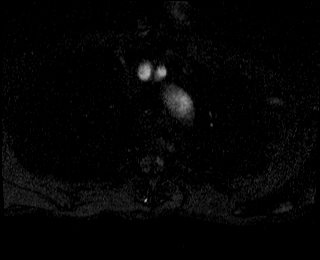
[im 15/90]
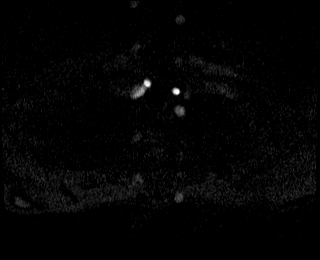
[im 30/90]
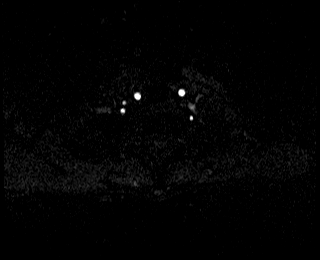
[im 45/90]
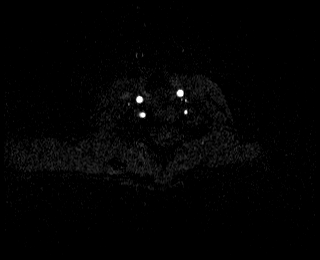
[im 60/90]
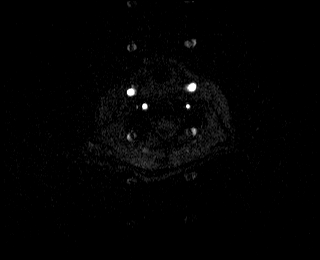
[im 75/90]
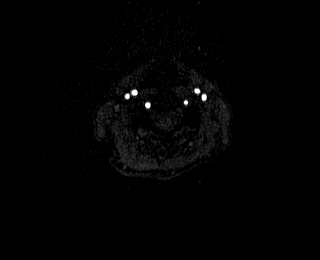
[im 90/90]
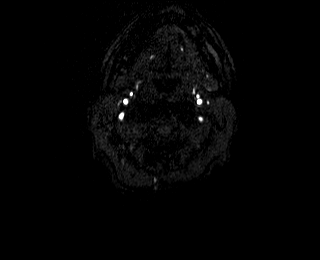

[Series 21: tof_fl2d_tra_mip_tra · axial · 152.5mm · 0.78mm/px · 1 of 1 slices shown]
[im 1/1]
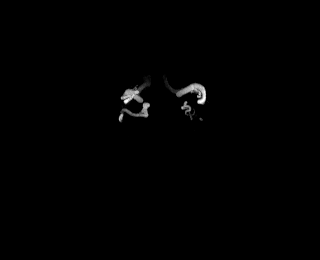

[Series 22: tof_fl2d_tra_mip_radials · axial · 0.78mm/px · 1 of 3 slices shown]
[im 1/3]
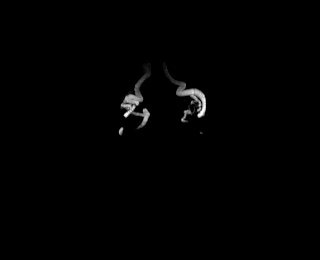

[Series 23: t1_vibe_dixon_tra_opp · axial · 2.0mm · 0.69mm/px · z∈[-267,-80]mm · 9 of 96 slices shown]
[im 1/96]
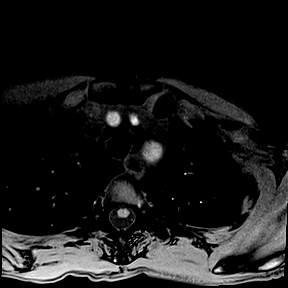
[im 12/96]
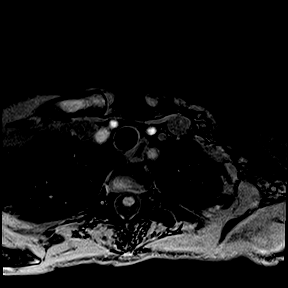
[im 24/96]
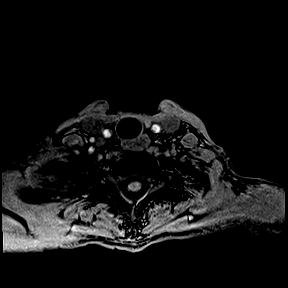
[im 36/96]
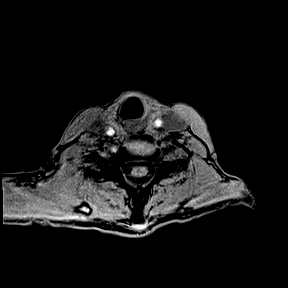
[im 48/96]
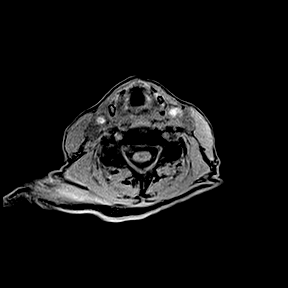
[im 60/96]
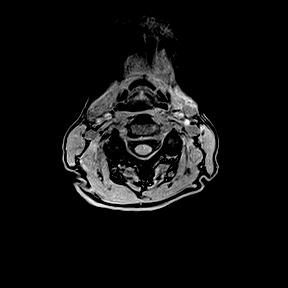
[im 72/96]
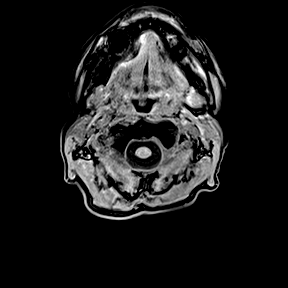
[im 84/96]
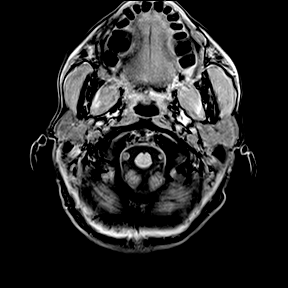
[im 96/96]
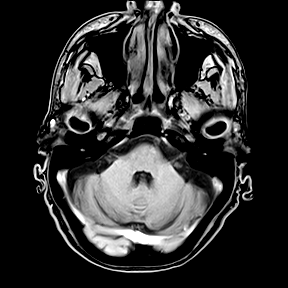

[Series 24: t1_vibe_dixon_tra_in · axial · 2.0mm · 0.69mm/px · z∈[-267,-80]mm · 9 of 96 slices shown]
[im 1/96]
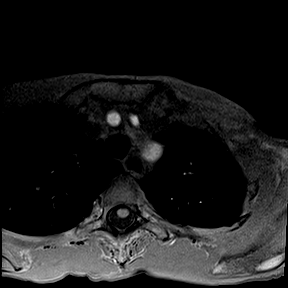
[im 12/96]
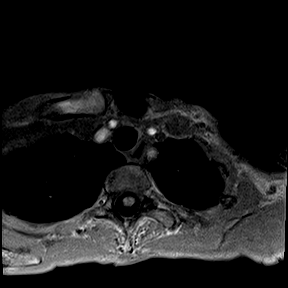
[im 24/96]
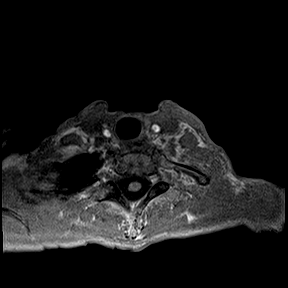
[im 36/96]
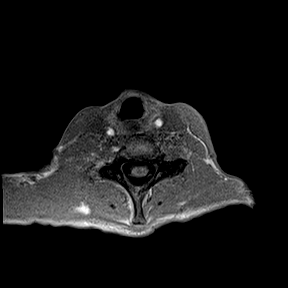
[im 48/96]
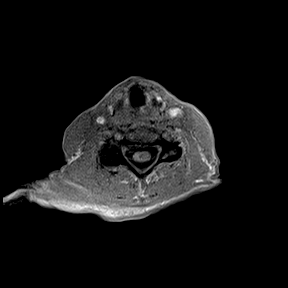
[im 60/96]
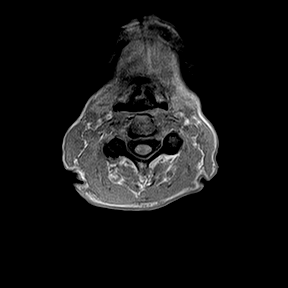
[im 72/96]
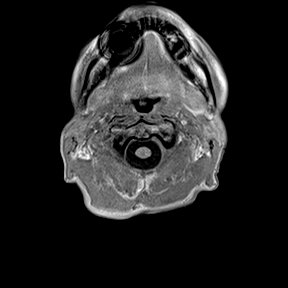
[im 84/96]
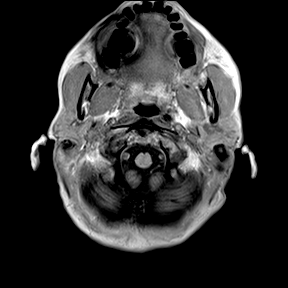
[im 96/96]
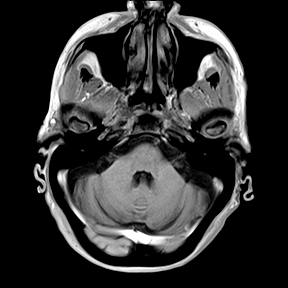

[Series 26: t1_vibe_dixon_tra_w · axial · 2.0mm · 0.69mm/px · z∈[-267,-80]mm · 8 of 96 slices shown]
[im 1/96]
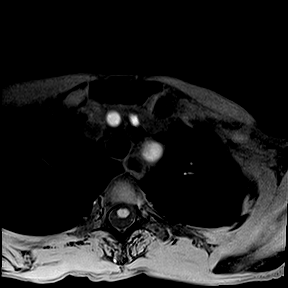
[im 12/96]
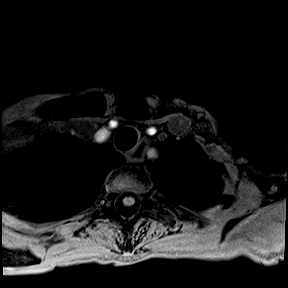
[im 24/96]
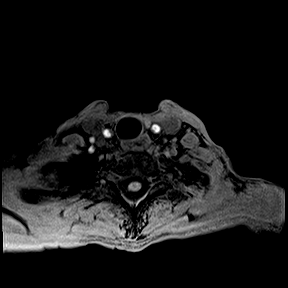
[im 36/96]
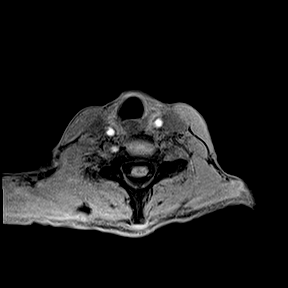
[im 60/96]
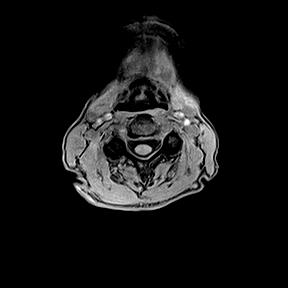
[im 72/96]
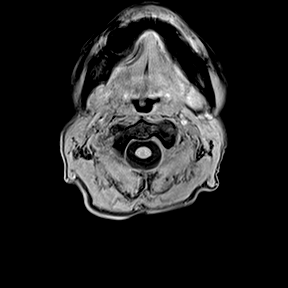
[im 84/96]
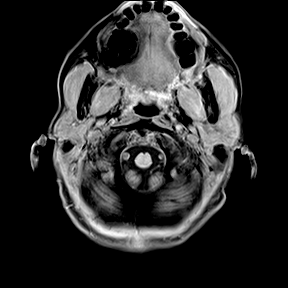
[im 96/96]
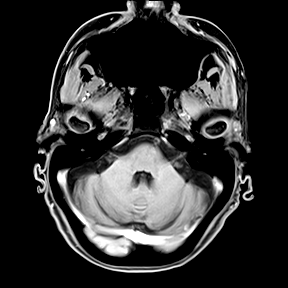

[46 of 48 positions shown; findings below may reference images not displayed]

FINDINGS: MRI HEAD FINDINGS

Brain: There is no evidence of acute infarct, intracranial
hemorrhage, mass, midline shift, or extra-axial fluid collection.
Mild cerebral atrophy is within normal limits for age. Patchy T2
hyperintensities in the cerebral white matter bilaterally are
nonspecific but compatible with mild-to-moderate chronic small
vessel ischemic disease. No abnormal enhancement is identified.

Vascular: Major intracranial vascular flow voids are preserved.

Skull and upper cervical spine: Unremarkable bone marrow signal.

Sinuses/Orbits: Right cataract extraction. Paranasal sinuses and
mastoid air cells are clear.

Other: None.

MRA HEAD FINDINGS

The visualized distal vertebral arteries are patent to the basilar
with the right being moderately dominant. Patent PICAs, AICAs, and
SCAs are seen bilaterally. The basilar artery is widely patent.
There is a small right posterior communicating artery. Both PCAs are
patent without evidence of a significant proximal stenosis.

The internal carotid arteries are widely patent from skull base to
carotid termini. ACAs and MCAs are patent without evidence of a
proximal branch occlusion or significant proximal stenosis. There is
a 1.5 mm left ACA A2 segment aneurysm.

MRA NECK FINDINGS

The aortic arch and left common carotid artery origin were not
imaged. The included portions of the common carotid arteries are
widely patent without evidence of stenosis or dissection. Both
internal carotid arteries are patent with flow artifact in the
carotid bulbs but no suspected significant stenosis or evidence of
dissection.

The vertebral arteries are patent with antegrade flow bilaterally.
The right vertebral artery is dominant. There is no evidence of a
significant vertebral artery stenosis or dissection.
IMPRESSION: 1. No acute intracranial abnormality.
2. Mild-to-moderate chronic small vessel ischemic disease.
3. No major intracranial arterial occlusion or significant proximal
stenosis.
4. 1.5 mm left ACA A2 segment aneurysm.
5. Negative neck MRA.

## 2019-07-17 IMAGING — MR MR MRA HEAD W/O CM
1 series · 18 of 48 positions shown · IV contrast (gadavist)
Comparison: Head CT [DATE]

CLINICAL DATA: Acute confusion.

EXAM:
MRI HEAD WITHOUT AND WITH CONTRAST
MRA HEAD WITHOUT CONTRAST
MRA NECK WITHOUT CONTRAST
TECHNIQUE: Multiplanar, multiecho pulse sequences of the brain and surrounding
structures were obtained without and with intravenous contrast.
Angiographic images of the Circle of Willis were obtained using MRA
technique without intravenous contrast. Angiographic images of the
neck were obtained using MRA technique without intravenous contrast.
Carotid stenosis measurements (when applicable) are obtained
utilizing NASCET criteria, using the distal internal carotid
diameter as the denominator.
CONTRAST:  5mL GADAVIST GADOBUTROL 1 MMOL/ML IV SOLN

[Series 5: 3d cow · axial · 0.5mm · 0.41mm/px · z∈[-80,+0]mm · 18 of 172 slices shown]
[im 1/172]
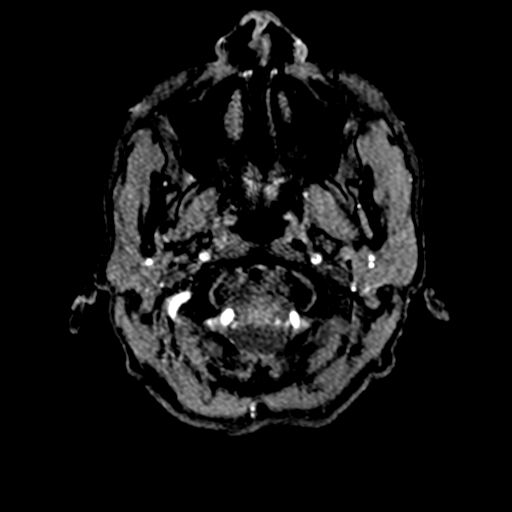
[im 4/172]
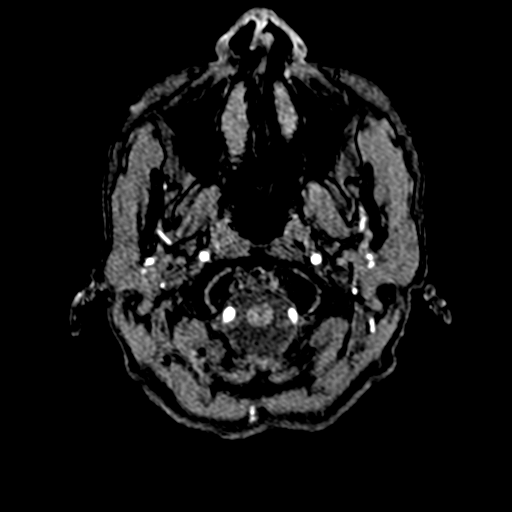
[im 8/172]
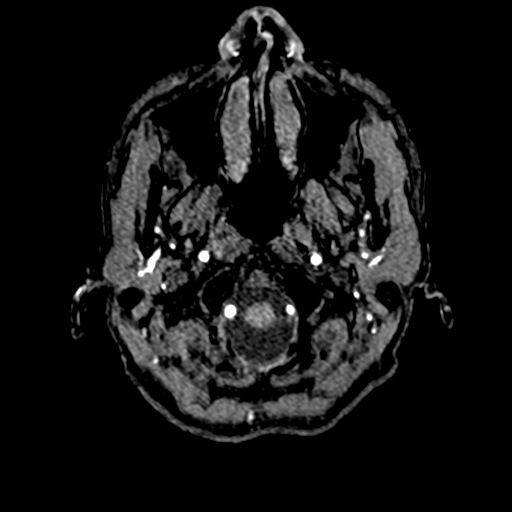
[im 11/172]
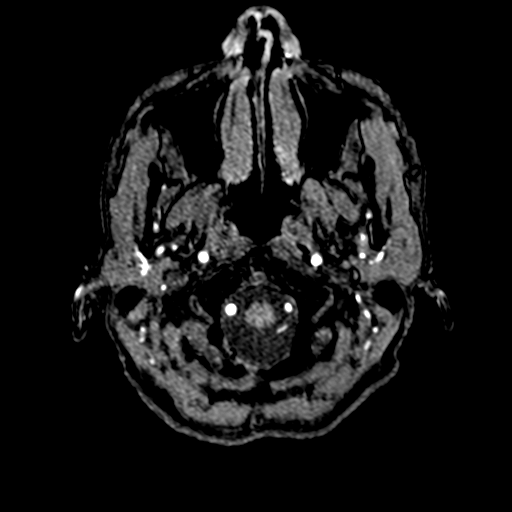
[im 15/172]
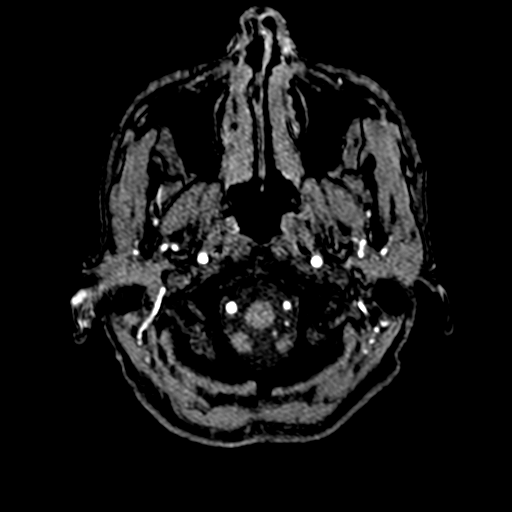
[im 19/172]
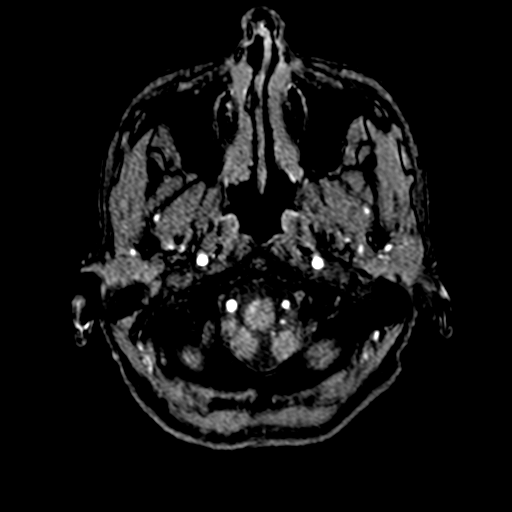
[im 22/172]
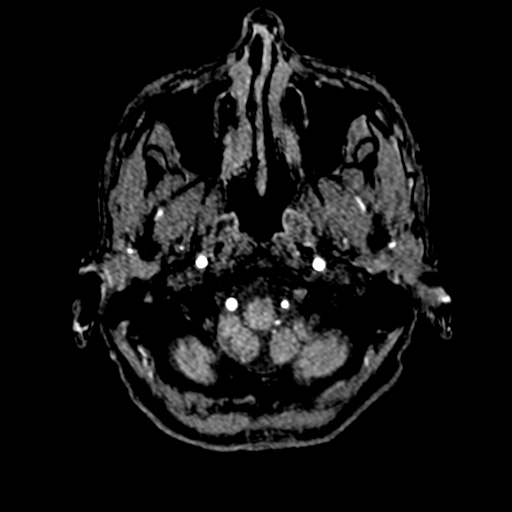
[im 26/172]
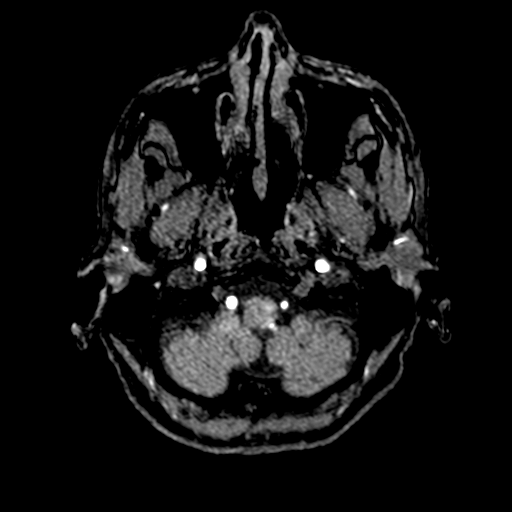
[im 30/172]
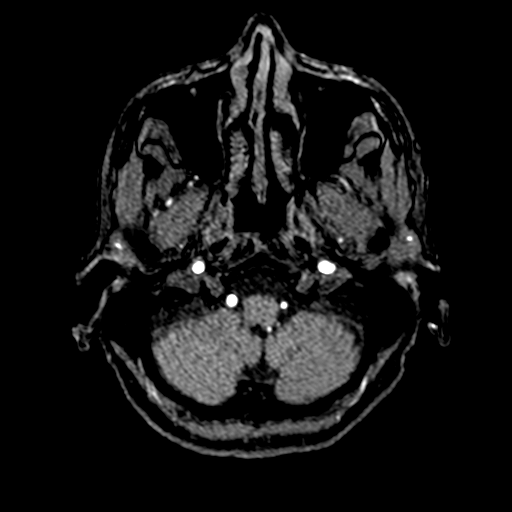
[im 33/172]
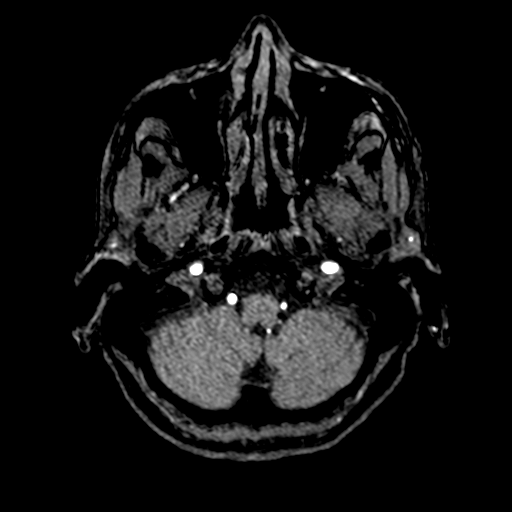
[im 55/172]
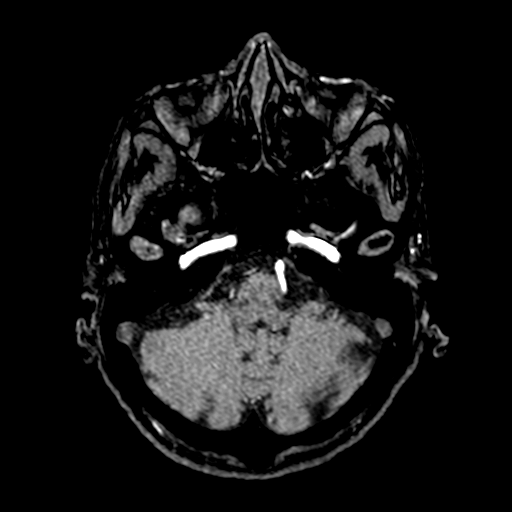
[im 77/172]
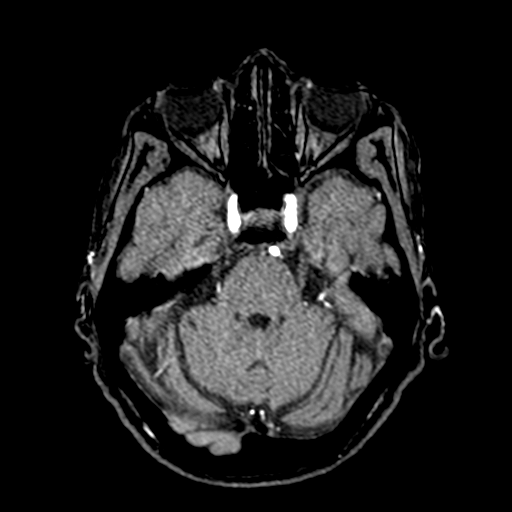
[im 88/172]
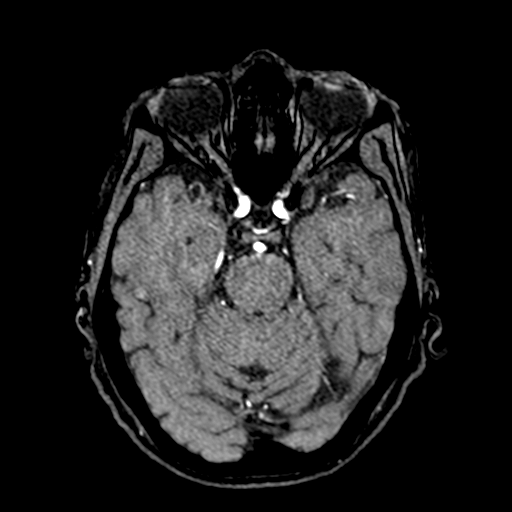
[im 99/172]
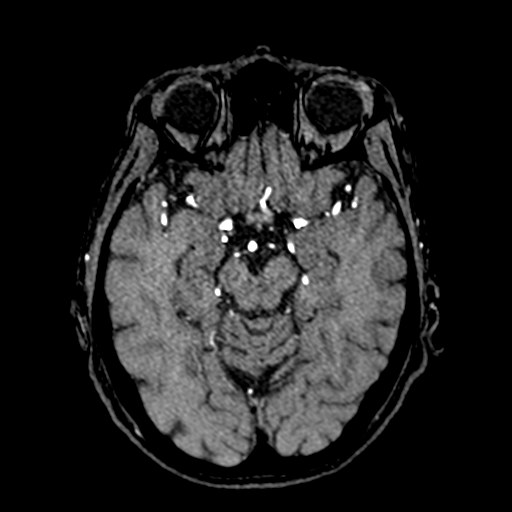
[im 121/172]
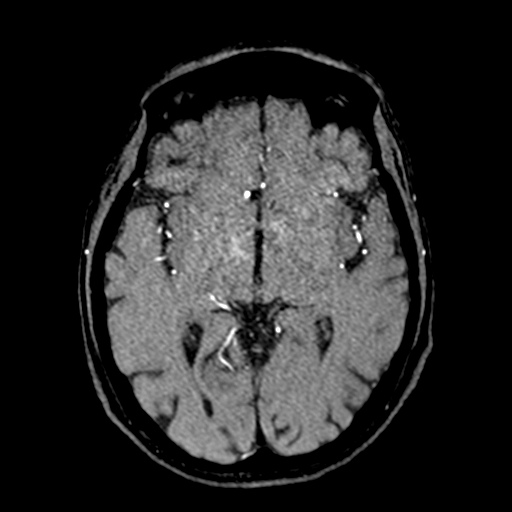
[im 142/172]
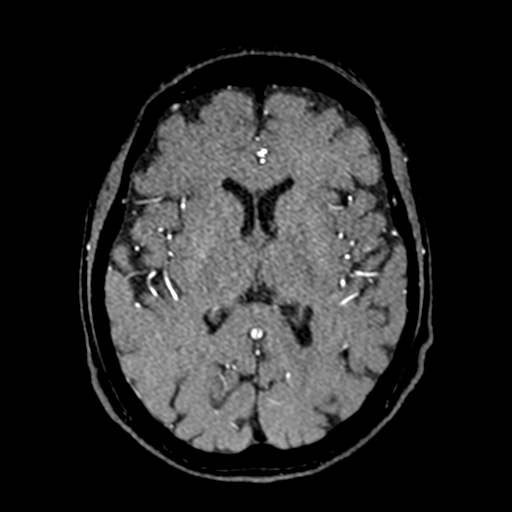
[im 146/172]
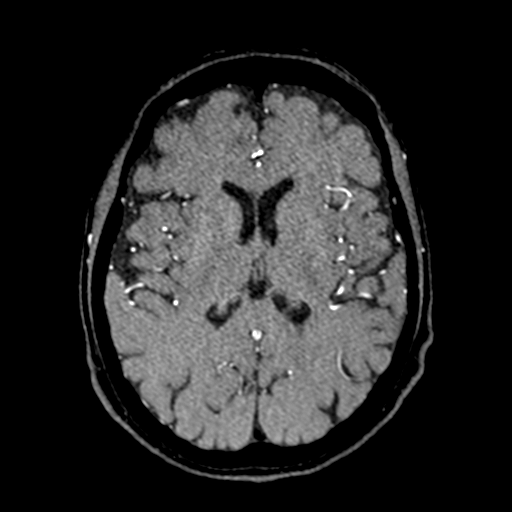
[im 164/172]
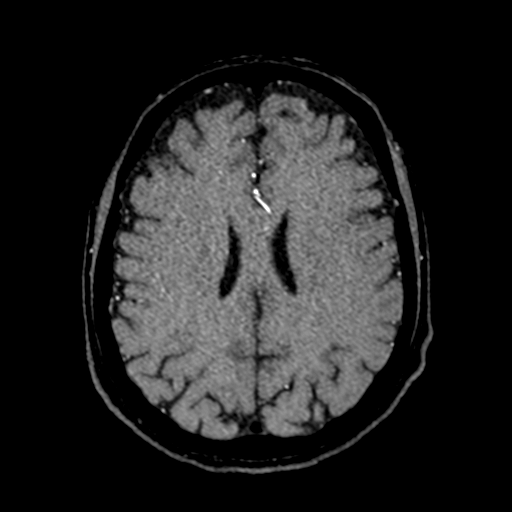

[18 of 48 positions shown; findings below may reference images not displayed]

FINDINGS: MRI HEAD FINDINGS

Brain: There is no evidence of acute infarct, intracranial
hemorrhage, mass, midline shift, or extra-axial fluid collection.
Mild cerebral atrophy is within normal limits for age. Patchy T2
hyperintensities in the cerebral white matter bilaterally are
nonspecific but compatible with mild-to-moderate chronic small
vessel ischemic disease. No abnormal enhancement is identified.

Vascular: Major intracranial vascular flow voids are preserved.

Skull and upper cervical spine: Unremarkable bone marrow signal.

Sinuses/Orbits: Right cataract extraction. Paranasal sinuses and
mastoid air cells are clear.

Other: None.

MRA HEAD FINDINGS

The visualized distal vertebral arteries are patent to the basilar
with the right being moderately dominant. Patent PICAs, AICAs, and
SCAs are seen bilaterally. The basilar artery is widely patent.
There is a small right posterior communicating artery. Both PCAs are
patent without evidence of a significant proximal stenosis.

The internal carotid arteries are widely patent from skull base to
carotid termini. ACAs and MCAs are patent without evidence of a
proximal branch occlusion or significant proximal stenosis. There is
a 1.5 mm left ACA A2 segment aneurysm.

MRA NECK FINDINGS

The aortic arch and left common carotid artery origin were not
imaged. The included portions of the common carotid arteries are
widely patent without evidence of stenosis or dissection. Both
internal carotid arteries are patent with flow artifact in the
carotid bulbs but no suspected significant stenosis or evidence of
dissection.

The vertebral arteries are patent with antegrade flow bilaterally.
The right vertebral artery is dominant. There is no evidence of a
significant vertebral artery stenosis or dissection.
IMPRESSION: 1. No acute intracranial abnormality.
2. Mild-to-moderate chronic small vessel ischemic disease.
3. No major intracranial arterial occlusion or significant proximal
stenosis.
4. 1.5 mm left ACA A2 segment aneurysm.
5. Negative neck MRA.

## 2019-07-17 MED ORDER — ACETAMINOPHEN 650 MG RE SUPP: 650.0000 mg | RECTAL | Status: DC | PRN

## 2019-07-17 MED ORDER — ACETAMINOPHEN 325 MG PO TABS: 650.0000 mg | ORAL_TABLET | ORAL | Status: DC | PRN

## 2019-07-17 MED ORDER — CLOPIDOGREL BISULFATE 75 MG PO TABS: 75.0000 mg | ORAL_TABLET | Freq: Every day | ORAL | Status: DC

## 2019-07-17 MED ORDER — ASPIRIN 300 MG RE SUPP: 300.0000 mg | Freq: Every day | RECTAL | Status: DC

## 2019-07-17 MED ORDER — ASPIRIN EC 81 MG PO TBEC: 81.0000 mg | DELAYED_RELEASE_TABLET | Freq: Every day | ORAL | Status: DC

## 2019-07-17 MED ORDER — ACETAMINOPHEN 160 MG/5ML PO SOLN: 650.0000 mg | ORAL | Status: DC | PRN

## 2019-07-17 MED ORDER — ATORVASTATIN CALCIUM 10 MG PO TABS: 20.0000 mg | ORAL_TABLET | Freq: Every day | ORAL | Status: DC

## 2019-07-17 MED ORDER — HYDRALAZINE HCL 20 MG/ML IJ SOLN: 10.0000 mg | INTRAMUSCULAR | Status: DC | PRN

## 2019-07-17 MED ORDER — CLOPIDOGREL BISULFATE 75 MG PO TABS: 75.0000 mg | ORAL_TABLET | Freq: Every day | ORAL | 0 refills | Status: AC

## 2019-07-17 MED ORDER — ATORVASTATIN CALCIUM 20 MG PO TABS: 20.0000 mg | ORAL_TABLET | Freq: Every day | ORAL | 3 refills | Status: DC

## 2019-07-17 MED ORDER — ASPIRIN 325 MG PO TABS
325.0000 mg | ORAL_TABLET | Freq: Every day | ORAL | Status: DC
  Administered 2019-07-17: 325 mg via ORAL
  Filled 2019-07-17: qty 1

## 2019-07-17 MED ORDER — STROKE: EARLY STAGES OF RECOVERY BOOK
Freq: Once | Status: AC
  Filled 2019-07-17: qty 1

## 2019-07-17 MED ORDER — GADOBUTROL 1 MMOL/ML IV SOLN
5.0000 mL | Freq: Once | INTRAVENOUS | Status: AC | PRN
  Administered 2019-07-17: 5 mL via INTRAVENOUS

## 2019-07-17 MED ORDER — ENOXAPARIN SODIUM 40 MG/0.4ML ~~LOC~~ SOLN
40.0000 mg | SUBCUTANEOUS | Status: DC
  Administered 2019-07-17: 40 mg via SUBCUTANEOUS
  Filled 2019-07-17: qty 0.4

## 2019-07-17 MED ORDER — SODIUM CHLORIDE 0.9 % IV SOLN: INTRAVENOUS | Status: DC

## 2019-07-17 MED ORDER — ASPIRIN 81 MG PO TBEC: 81.0000 mg | DELAYED_RELEASE_TABLET | Freq: Every day | ORAL | 11 refills | Status: AC

## 2019-07-17 NOTE — Procedures (Signed)
Patient Name: Brianna Mccoy  MRN: [id number]  Epilepsy Attending: Charlsie Quest  Referring Physician/Provider: Dr Midge Minium Date: 09/17/2019  Duration: 26.59 mins  Patient history: 64yo F with transient global amnesia. EEG to evaluate for seizure.  Level of alertness: Awake  AEDs during EEG study: None  Technical aspects: This EEG study was done with scalp electrodes positioned according to the 10-20 International system of electrode placement. Electrical activity was acquired at a sampling rate of 500Hz  and reviewed with a high frequency filter of 70Hz  and a low frequency filter of 1Hz . EEG data were recorded continuously and digitally stored.   Description: No clear posterior dominant rhythm was seen. There is 13-15 Hz beta activity with irregular morphology distributed symmetrically and diffusely. Physiology photic driving was seen during photic stimulation.  Hyperventilation was not performed.     ABNORMALITY -Excessive beta, generalized  IMPRESSION: This study is within normal limits. No seizures or epileptiform discharges were seen throughout the recording. The excessive beta activity seen in the background is most likely due to the effect of benzodiazepine and is a benign EEG pattern.   Morgan Rennert 

## 2019-07-17 NOTE — Evaluation (Signed)
Physical Therapy Evaluation and Discharge Patient Details Name: Brianna Mccoy MRN: [id number] DOB: 1955/11/20 Today's Date: 07/17/2019   History of Present Illness  64 y.o. female with history of hypertension presented to the ER 07/16/19 with having lost memory for a few minutes after she woke up. Her husband found that she was confused not remembering things she had been doing.  And had some staring spells.  This lasted for around 15 to 20 minutes following which she resolved. CT head and MRI brain negative for acute changes. EEG pending  Clinical Impression   Patient evaluated by Physical Therapy with no further acute PT needs identified. All education has been completed and the patient has no further questions.  See below for any follow-up Physical Therapy or equipment needs. PT is signing off. Thank you for this referral.     Follow Up Recommendations No PT follow up    Equipment Recommendations  None recommended by PT    Recommendations for Other Services       Precautions / Restrictions Precautions Precautions: None      Mobility  Bed Mobility Overal bed mobility: Independent                Transfers Overall transfer level: Independent                  Ambulation/Gait Ambulation/Gait assistance: Independent Gait Distance (Feet): 200 Feet Assistive device: None Gait Pattern/deviations: WFL(Within Functional Limits)        Stairs            Wheelchair Mobility    Modified Rankin (Stroke Patients Only) Modified Rankin (Stroke Patients Only) Pre-Morbid Rankin Score: No symptoms Modified Rankin: No symptoms     Balance Overall balance assessment: Independent                 Single Leg Stance - Left Leg: 30 Tandem Stance - Right Leg: 10 Tandem Stance - Left Leg: 10 Rhomberg - Eyes Opened: 10 Rhomberg - Eyes Closed: 10 High level balance activites: Backward walking;Direction changes;Head turns High Level Balance Comments: could  have held above postures longer, but with normal amount of sway and tests ended by PT; tandem walk foward and backward without difficulty             Pertinent Vitals/Pain Pain Assessment: No/denies pain    Home Living Family/patient expects to be discharged to:: Private residence Living Arrangements: Spouse/significant other Available Help at Discharge: Family             Additional Comments: not fully assessed with no balance deficits-full home set-up not needed    Prior Function Level of Independence: Independent         Comments: exercises every day (jog, elliptical, likes to water ski); retired     Higher education careers adviser        Extremity/Trunk Assessment   Upper Extremity Assessment Upper Extremity Assessment: Overall WFL for tasks assessed    Lower Extremity Assessment Lower Extremity Assessment: Overall WFL for tasks assessed    Cervical / Trunk Assessment Cervical / Trunk Assessment: Normal  Communication   Communication: No difficulties  Cognition Arousal/Alertness: Awake/alert Behavior During Therapy: WFL for tasks assessed/performed Overall Cognitive Status: Within Functional Limits for tasks assessed                                 General Comments: reports she still does not remember the 15-20 minutes  of time she "lost" yesterday; reports no other symptoms      General Comments General comments (skin integrity, edema, etc.): husband present and agrees she is walking normally    Exercises     Assessment/Plan    PT Assessment Patent does not need any further PT services  PT Problem List         PT Treatment Interventions      PT Goals (Current goals can be found in the Care Plan section)  Acute Rehab PT Goals Patient Stated Goal: go home today PT Goal Formulation: All assessment and education complete, DC therapy    Frequency     Barriers to discharge        Co-evaluation               AM-PAC PT "6 Clicks"  Mobility  Outcome Measure Help needed turning from your back to your side while in a flat bed without using bedrails?: None Help needed moving from lying on your back to sitting on the side of a flat bed without using bedrails?: None Help needed moving to and from a bed to a chair (including a wheelchair)?: None Help needed standing up from a chair using your arms (e.g., wheelchair or bedside chair)?: None Help needed to walk in hospital room?: None Help needed climbing 3-5 steps with a railing? : None 6 Click Score: 24    End of Session Equipment Utilized During Treatment: Gait belt Activity Tolerance: Patient tolerated treatment well Patient left: in chair;with call bell/phone within reach;with family/visitor present Nurse Communication: Mobility status;Other (comment) (OK to be up in room) PT Visit Diagnosis: Other symptoms and signs involving the nervous system (T46.568)    Time: 1275-1700 PT Time Calculation (min) (ACUTE ONLY): 12 min   Charges:   PT Evaluation $PT Eval Low Complexity: 1 Low           Jerolyn Center, PT Pager [phone]   Zena Amos 07/17/2019, 9:49 AM

## 2019-07-17 NOTE — Progress Notes (Signed)
OT Cancellation Note  Patient Details Name: Brianna Mccoy MRN: [id number] DOB: Apr 02, 1955   Cancelled Treatment:    Reason Eval/Treat Not Completed: Patient at procedure or test/ unavailable. Per RN pt in EEG. Will follow when able.  [name]/[name] [name] 07/17/2019, 8:09 AM

## 2019-07-17 NOTE — Consult Note (Signed)
Stroke Neurology Consultation Note  Consult Requested by: [facility], seen be teleneurology 07/16/19 at [facility]   Reason for Consult: altered mental status   Consult Date:  07/17/19  History of Present Illness:  Brianna Mccoy is an 64 y.o. Caucasian female with PMH of HTN who had transient confusion yesterday morning. She had just finished elipitical exercise in the morning 7/11 and planning to run 3-4 miles outside. She called her husband and asking him why he put her clothes on the top of the table. Husband stated that he did not do that and the clothes had been always there. She then pointed to the shoes she bought two weeks ago and not recognized them. She then pointed to the luggage on the ground and not recognized she did them for the weekend beach travel. She seemed confused about what happened and staring and trying to figure it out. Husband called her PCP and after the call, pt stated that she felt much better and back to baseline. Husband checked on her, she seems much better and near normal. The whole episode lasted about 15-25min. However, in ER, pt can not remember what happened at that time.  Seen by teleneurology at [facility] who did not feel event focal. Admitted for further neuro work up.   Date last known well: Date: 07/16/2019 Time last known well: Time: 09:18 am tPA Given: No: NIH 0, nonfocal MRS:  0 NIHSS:  0  Past Medical History:  Diagnosis Date  . Hypertension     History reviewed. No pertinent surgical history.  Family History  Problem Relation Age of Onset  . Diabetes Mellitus II Mother      Social History:  reports that she has never smoked. She has never used smokeless tobacco. She reports current alcohol use. She reports that she does not use drugs.  Review of Systems: A full ROS was attempted and was  able to be performed.  Systems assessed include - Constitutional, Eyes, HENT, Respiratory, Cardiovascular, Gastrointestinal,  Genitourinary, Integument/breast, Hematologic/lymphatic, Musculoskeletal, Neurological, Behavioral/Psych, Endocrine, Allergic/Immunologic - with pertinent responses as per HPI.  Allergies: No Known Allergies   Medications: I have reviewed the patient's current medications.  Test Results: CBC:  Recent Labs  Lab 07/16/19 1011 07/17/19 0213  WBC 3.5* 4.0  NEUTROABS 2.2  --   HGB 14.5 13.3  HCT 45.3 41.4  MCV 93.6 94.7  PLT 165 152   Basic Metabolic Panel:  Recent Labs  Lab 07/16/19 1011 07/17/19 0213  NA 139  --   K 3.6  --   CL 101  --   CO2 27  --   GLUCOSE 100*  --   BUN 15  --   CREATININE 0.84 0.95  CALCIUM 9.1  --    Liver Function Tests: Recent Labs  Lab 07/16/19 1011  AST 27  ALT 32  ALKPHOS 60  BILITOT 0.8  PROT 6.9  ALBUMIN 4.2   Coagulation Studies:  Recent Labs    07/16/19 1011  LABPROT 12.5  INR 1.0   Urinalysis:  Recent Labs  Lab 07/16/19 1030  COLORURINE YELLOW  LABSPEC <1.005*  PHURINE 6.0  GLUCOSEU NEGATIVE  HGBUR NEGATIVE  BILIRUBINUR NEGATIVE  KETONESUR NEGATIVE  PROTEINUR NEGATIVE  NITRITE NEGATIVE  LEUKOCYTESUR NEGATIVE   Microbiology:  Results for orders placed or performed during the hospital encounter of 07/16/19  SARS Coronavirus 2 by RT PCR (hospital order, performed in [facility] hospital lab) Nasopharyngeal Nasopharyngeal Swab  Status: None   Collection Time: 07/16/19  2:05 PM   Specimen: Nasopharyngeal Swab  Result Value Ref Range Status   SARS Coronavirus 2 NEGATIVE NEGATIVE Final    Comment: (NOTE) SARS-CoV-2 target nucleic acids are NOT DETECTED.  The SARS-CoV-2 RNA is generally detectable in upper and lower respiratory specimens during the acute phase of infection. The lowest concentration of SARS-CoV-2 viral copies this assay can detect is 250 copies / mL. A negative result does not preclude SARS-CoV-2 infection and should not be used as the sole basis for treatment or other patient management  decisions.  A negative result may occur with improper specimen collection / handling, submission of specimen other than nasopharyngeal swab, presence of viral mutation(s) within the areas targeted by this assay, and inadequate number of viral copies (<250 copies / mL). A negative result must be combined with clinical observations, patient history, and epidemiological information.  Fact Sheet for Patients:   BoilerBrush.com.cy  Fact Sheet for Healthcare Providers: https://pope.com/  This test is not yet approved or  cleared by the [place] FDA and has been authorized for detection and/or diagnosis of SARS-CoV-2 by FDA under an Emergency Use Authorization (EUA).  This EUA will remain in effect (meaning this test can be used) for the duration of the COVID-19 declaration under Section 564(b)(1) of the Act, 21 U.S.C. section 360bbb-3(b)(1), unless the authorization is terminated or revoked sooner.  Performed at [facility], [address] [place], [place] [address]    Lipid Panel:     Component Value Date/Time   CHOL 194 07/17/2019 0213   TRIG 61 07/17/2019 0213   HDL 90 07/17/2019 0213   CHOLHDL 2.2 07/17/2019 0213   VLDL 12 07/17/2019 0213   LDLCALC 92 07/17/2019 0213   HgbA1c:  Lab Results  Component Value Date   HGBA1C 5.3 07/17/2019   Urine Drug Screen:     Component Value Date/Time   LABOPIA NONE DETECTED 07/16/2019 1031   COCAINSCRNUR NONE DETECTED 07/16/2019 1031   LABBENZ NONE DETECTED 07/16/2019 1031   AMPHETMU NONE DETECTED 07/16/2019 1031   THCU NONE DETECTED 07/16/2019 1031   LABBARB NONE DETECTED 07/16/2019 1031    Alcohol Level:  Recent Labs  Lab 07/16/19 1004  ETH <10    EEG  Result Date: 07/17/2019 Charlsie Quest, MD     07/17/2019 11:16 AM Patient Name: KESHONNA VALVO MRN: [id number] Epilepsy Attending: Charlsie Quest Referring Physician/Provider: Dr Midge Minium Date:  09/17/2019 Duration: 26.59 mins Patient history: 64yo F with transient global amnesia. EEG to evaluate for seizure. Level of alertness: Awake AEDs during EEG study: None Technical aspects: This EEG study was done with scalp electrodes positioned according to the 10-20 International system of electrode placement. Electrical activity was acquired at a sampling rate of 500Hz  and reviewed with a high frequency filter of 70Hz  and a low frequency filter of 1Hz . EEG data were recorded continuously and digitally stored. Description: No clear posterior dominant rhythm was seen. There is 13-15 Hz beta activity with irregular morphology distributed symmetrically and diffusely. Physiology photic driving was seen during photic stimulation.  Hyperventilation was not performed.   ABNORMALITY -Excessive beta, generalized IMPRESSION: This study is within normal limits. No seizures or epileptiform discharges were seen throughout the recording. The excessive beta activity seen in the background is most likely due to the effect of benzodiazepine and is a benign EEG pattern.   CT HEAD WO CONTRAST  Result Date: 07/16/2019 CLINICAL  DATA:  Confusion EXAM: CT HEAD WITHOUT CONTRAST TECHNIQUE: Contiguous axial images were obtained from the base of the skull through the vertex without intravenous contrast. COMPARISON:  None. FINDINGS: Brain: Mild diffuse atrophy. No evidence of acute infarction, hemorrhage, hydrocephalus, extra-axial collection or mass lesion/mass effect. Vascular: No hyperdense vessel or unexpected calcification. Skull: Normal. Negative for fracture or focal lesion. Sinuses/Orbits: No acute finding. Other: None. IMPRESSION: 1. Negative for bleed or other acute intracranial process. 2. Mild diffuse atrophy. Electronically Signed   By: Corlis Leak M.D.   On: 07/16/2019 09:28   MR ANGIO HEAD WO CONTRAST  Result Date: 07/17/2019 CLINICAL DATA:  Acute confusion. EXAM: MRI HEAD WITHOUT AND WITH CONTRAST MRA  HEAD WITHOUT CONTRAST MRA NECK WITHOUT CONTRAST TECHNIQUE: Multiplanar, multiecho pulse sequences of the brain and surrounding structures were obtained without and with intravenous contrast. Angiographic images of the Circle of Willis were obtained using MRA technique without intravenous contrast. Angiographic images of the neck were obtained using MRA technique without intravenous contrast. Carotid stenosis measurements (when applicable) are obtained utilizing NASCET criteria, using the distal internal carotid diameter as the denominator. CONTRAST:  8mL GADAVIST GADOBUTROL 1 MMOL/ML IV SOLN COMPARISON:  Head CT 07/16/2019 FINDINGS: MRI HEAD FINDINGS Brain: There is no evidence of acute infarct, intracranial hemorrhage, mass, midline shift, or extra-axial fluid collection. Mild cerebral atrophy is within normal limits for age. Patchy T2 hyperintensities in the cerebral white matter bilaterally are nonspecific but compatible with mild-to-moderate chronic small vessel ischemic disease. No abnormal enhancement is identified. Vascular: Major intracranial vascular flow voids are preserved. Skull and upper cervical spine: Unremarkable bone marrow signal. Sinuses/Orbits: Right cataract extraction. Paranasal sinuses and mastoid air cells are clear. Other: None. MRA HEAD FINDINGS The visualized distal vertebral arteries are patent to the basilar with the right being moderately dominant. Patent PICAs, AICAs, and SCAs are seen bilaterally. The basilar artery is widely patent. There is a small right posterior communicating artery. Both PCAs are patent without evidence of a significant proximal stenosis. The internal carotid arteries are widely patent from skull base to carotid termini. ACAs and MCAs are patent without evidence of a proximal branch occlusion or significant proximal stenosis. There is a 1.5 mm left ACA A2 segment aneurysm. MRA NECK FINDINGS The aortic arch and left common carotid artery origin were not imaged.  The included portions of the common carotid arteries are widely patent without evidence of stenosis or dissection. Both internal carotid arteries are patent with flow artifact in the carotid bulbs but no suspected significant stenosis or evidence of dissection. The vertebral arteries are patent with antegrade flow bilaterally. The right vertebral artery is dominant. There is no evidence of a significant vertebral artery stenosis or dissection. IMPRESSION: 1. No acute intracranial abnormality. 2. Mild-to-moderate chronic small vessel ischemic disease. 3. No major intracranial arterial occlusion or significant proximal stenosis. 4. 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 5. Negative neck MRA. Electronically Signed   By: Sebastian Ache M.D.   On: 07/17/2019 05:45   MR ANGIO NECK WO CONTRAST  Result Date: 07/17/2019 CLINICAL DATA:  Acute confusion. EXAM: MRI HEAD WITHOUT AND WITH CONTRAST MRA HEAD WITHOUT CONTRAST MRA NECK WITHOUT CONTRAST TECHNIQUE: Multiplanar, multiecho pulse sequences of the brain and surrounding structures were obtained without and with intravenous contrast. Angiographic images of the Circle of Willis were obtained using MRA technique without intravenous contrast. Angiographic images of the neck were obtained using MRA technique without intravenous contrast. Carotid stenosis measurements (when applicable) are obtained utilizing  NASCET criteria, using the distal internal carotid diameter as the denominator. CONTRAST:  5mL GADAVIST GADOBUTROL 1 MMOL/ML IV SOLN COMPARISON:  Head CT 07/16/2019 FINDINGS: MRI HEAD FINDINGS Brain: There is no evidence of acute infarct, intracranial hemorrhage, mass, midline shift, or extra-axial fluid collection. Mild cerebral atrophy is within normal limits for age. Patchy T2 hyperintensities in the cerebral white matter bilaterally are nonspecific but compatible with mild-to-moderate chronic small vessel ischemic disease. No abnormal enhancement is identified. Vascular:  Major intracranial vascular flow voids are preserved. Skull and upper cervical spine: Unremarkable bone marrow signal. Sinuses/Orbits: Right cataract extraction. Paranasal sinuses and mastoid air cells are clear. Other: None. MRA HEAD FINDINGS The visualized distal vertebral arteries are patent to the basilar with the right being moderately dominant. Patent PICAs, AICAs, and SCAs are seen bilaterally. The basilar artery is widely patent. There is a small right posterior communicating artery. Both PCAs are patent without evidence of a significant proximal stenosis. The internal carotid arteries are widely patent from skull base to carotid termini. ACAs and MCAs are patent without evidence of a proximal branch occlusion or significant proximal stenosis. There is a 1.5 mm left ACA A2 segment aneurysm. MRA NECK FINDINGS The aortic arch and left common carotid artery origin were not imaged. The included portions of the common carotid arteries are widely patent without evidence of stenosis or dissection. Both internal carotid arteries are patent with flow artifact in the carotid bulbs but no suspected significant stenosis or evidence of dissection. The vertebral arteries are patent with antegrade flow bilaterally. The right vertebral artery is dominant. There is no evidence of a significant vertebral artery stenosis or dissection. IMPRESSION: 1. No acute intracranial abnormality. 2. Mild-to-moderate chronic small vessel ischemic disease. 3. No major intracranial arterial occlusion or significant proximal stenosis. 4. 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 5. Negative neck MRA. Electronically Signed   By: Sebastian AcheAllen  Grady M.D.   On: 07/17/2019 05:45   MR BRAIN W WO CONTRAST  Result Date: 07/17/2019 CLINICAL DATA:  Acute confusion. EXAM: MRI HEAD WITHOUT AND WITH CONTRAST MRA HEAD WITHOUT CONTRAST MRA NECK WITHOUT CONTRAST TECHNIQUE: Multiplanar, multiecho pulse sequences of the brain and surrounding structures were obtained  without and with intravenous contrast. Angiographic images of the Circle of Willis were obtained using MRA technique without intravenous contrast. Angiographic images of the neck were obtained using MRA technique without intravenous contrast. Carotid stenosis measurements (when applicable) are obtained utilizing NASCET criteria, using the distal internal carotid diameter as the denominator. CONTRAST:  5mL GADAVIST GADOBUTROL 1 MMOL/ML IV SOLN COMPARISON:  Head CT 07/16/2019 FINDINGS: MRI HEAD FINDINGS Brain: There is no evidence of acute infarct, intracranial hemorrhage, mass, midline shift, or extra-axial fluid collection. Mild cerebral atrophy is within normal limits for age. Patchy T2 hyperintensities in the cerebral white matter bilaterally are nonspecific but compatible with mild-to-moderate chronic small vessel ischemic disease. No abnormal enhancement is identified. Vascular: Major intracranial vascular flow voids are preserved. Skull and upper cervical spine: Unremarkable bone marrow signal. Sinuses/Orbits: Right cataract extraction. Paranasal sinuses and mastoid air cells are clear. Other: None. MRA HEAD FINDINGS The visualized distal vertebral arteries are patent to the basilar with the right being moderately dominant. Patent PICAs, AICAs, and SCAs are seen bilaterally. The basilar artery is widely patent. There is a small right posterior communicating artery. Both PCAs are patent without evidence of a significant proximal stenosis. The internal carotid arteries are widely patent from skull base to carotid termini. ACAs and MCAs are patent  without evidence of a proximal branch occlusion or significant proximal stenosis. There is a 1.5 mm left ACA A2 segment aneurysm. MRA NECK FINDINGS The aortic arch and left common carotid artery origin were not imaged. The included portions of the common carotid arteries are widely patent without evidence of stenosis or dissection. Both internal carotid arteries are  patent with flow artifact in the carotid bulbs but no suspected significant stenosis or evidence of dissection. The vertebral arteries are patent with antegrade flow bilaterally. The right vertebral artery is dominant. There is no evidence of a significant vertebral artery stenosis or dissection. IMPRESSION: 1. No acute intracranial abnormality. 2. Mild-to-moderate chronic small vessel ischemic disease. 3. No major intracranial arterial occlusion or significant proximal stenosis. 4. 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 5. Negative neck MRA. Electronically Signed   By: Sebastian Ache M.D.   On: 07/17/2019 05:45   ECHOCARDIOGRAM COMPLETE  Result Date: 07/17/2019    ECHOCARDIOGRAM REPORT   Patient Name:   DONIS KOTOWSKI Date of Exam: 07/17/2019 Medical Rec #:  [id number]        Height:       64.0 in Accession #:    [id number]       Weight:       106.5 lb Date of Birth:  01-Jul-1955        BSA:          1.497 m Patient Age:    63 years         BP:           141/87 mmHg Patient Gender: F                HR:           55 bpm. Exam Location:  Inpatient Procedure: 2D Echo Indications:    TIA 435.9  History:        Patient has no prior history of Echocardiogram examinations.                 Risk Factors:Hypertension.  Sonographer:    Delcie Roch Referring Phys: [id number] Ebert Forrester IMPRESSIONS  1. Left ventricular ejection fraction, by estimation, is 60 to 65%. The left ventricle has normal function. The left ventricle has no regional wall motion abnormalities. Left ventricular diastolic parameters were normal.  2. Right ventricular systolic function is normal. The right ventricular size is normal. There is normal pulmonary artery systolic pressure.  3. The mitral valve is normal in structure. Mild mitral valve regurgitation. No evidence of mitral stenosis.  4. The aortic valve is tricuspid. Aortic valve regurgitation is mild. Mild aortic valve sclerosis is present, with no evidence of aortic valve stenosis.  5. The  inferior vena cava is normal in size with greater than 50% respiratory variability, suggesting right atrial pressure of 3 mmHg. FINDINGS  Left Ventricle: Left ventricular ejection fraction, by estimation, is 60 to 65%. The left ventricle has normal function. The left ventricle has no regional wall motion abnormalities. The left ventricular internal cavity size was normal in size. There is  no left ventricular hypertrophy. Left ventricular diastolic parameters were normal. Right Ventricle: The right ventricular size is normal.Right ventricular systolic function is normal. There is normal pulmonary artery systolic pressure. The tricuspid regurgitant velocity is 2.62 m/s, and with an assumed right atrial pressure of 3 mmHg, the estimated right ventricular systolic pressure is 30.5 mmHg. Left Atrium: Left atrial size was normal in size. Right Atrium: Right atrial size was normal in  size. Pericardium: There is no evidence of pericardial effusion. Mitral Valve: The mitral valve is normal in structure. Normal mobility of the mitral valve leaflets. Mild mitral valve regurgitation. No evidence of mitral valve stenosis. Tricuspid Valve: The tricuspid valve is normal in structure. Tricuspid valve regurgitation is mild . No evidence of tricuspid stenosis. Aortic Valve: The aortic valve is tricuspid. Aortic valve regurgitation is mild. Mild aortic valve sclerosis is present, with no evidence of aortic valve stenosis. Pulmonic Valve: The pulmonic valve was normal in structure. Pulmonic valve regurgitation is trivial. No evidence of pulmonic stenosis. Aorta: The aortic root is normal in size and structure. Venous: The inferior vena cava is normal in size with greater than 50% respiratory variability, suggesting right atrial pressure of 3 mmHg. IAS/Shunts: No atrial level shunt detected by color flow Doppler.  LEFT VENTRICLE PLAX 2D LVIDd:         3.80 cm  Diastology LVIDs:         2.30 cm  LV e' lateral:   10.60 cm/s LV PW:          0.90 cm  LV E/e' lateral: 9.8 LV IVS:        0.70 cm  LV e' medial:    9.14 cm/s LVOT diam:     2.10 cm  LV E/e' medial:  11.4 LV SV:         79 LV SV Index:   53 LVOT Area:     3.46 cm  RIGHT VENTRICLE             IVC RV S prime:     17.40 cm/s  IVC diam: 1.00 cm TAPSE (M-mode): 2.8 cm LEFT ATRIUM             Index       RIGHT ATRIUM           Index LA diam:        3.00 cm 2.00 cm/m  RA Area:     12.90 cm LA Vol (A2C):   41.5 ml 27.73 ml/m RA Volume:   29.00 ml  19.38 ml/m LA Vol (A4C):   30.8 ml 20.58 ml/m LA Biplane Vol: 36.7 ml 24.52 ml/m  AORTIC VALVE LVOT Vmax:   82.20 cm/s LVOT Vmean:  53.900 cm/s LVOT VTI:    0.227 m  AORTA Ao Root diam: 2.80 cm MITRAL VALVE                TRICUSPID VALVE MV Area (PHT): 2.77 cm     TR Peak grad:   27.5 mmHg MV Decel Time: 274 msec     TR Vmax:        262.00 cm/s MV E velocity: 104.00 cm/s MV A velocity: 56.60 cm/s   SHUNTS MV E/A ratio:  1.84         Systemic VTI:  0.23 m                             Systemic Diam: 2.10 cm Olga Millers MD Electronically signed by Olga Millers MD Signature Date/Time: 07/17/2019/3:32:01 PM    Final     EKG: normal EKG, normal sinus rhythm, right axis deviation.  Physical Examination: Temp:  [97.8 F (36.6 C)-98.5 F (36.9 C)] 97.8 F (36.6 C) (07/12 0805) Pulse Rate:  [50-67] 51 (07/12 0805) Resp:  [13-29] 20 (07/12 0805) BP: (116-147)/(77-103) 131/85 (07/12 0805) SpO2:  [98 %-100 %] 99 % (07/12 0805)  Weight:  [48.3 kg] 48.3 kg (07/11 2322)  General - Well nourished, well developed, in no apparent distress.  Ophthalmologic - fundi not visualized due to noncooperation.  Cardiovascular - Regular rate and rhythm.  Mental Status -  Level of arousal and orientation to time, place, and person were intact. Language including expression, naming, repetition, comprehension was assessed and found intact. Attention span and concentration were normal. Fund of Knowledge was assessed and was intact.  Cranial Nerves II -  XII - II - Visual field intact OU. III, IV, VI - Extraocular movements intact. V - Facial sensation intact bilaterally. VII - Facial movement intact bilaterally. VIII - Hearing & vestibular intact bilaterally. X - Palate elevates symmetrically. XI - Chin turning & shoulder shrug intact bilaterally. XII - Tongue protrusion intact.  Motor Strength - The patient's strength was normal in all extremities and pronator drift was absent.  Bulk was normal and fasciculations were absent.   Motor Tone - Muscle tone was assessed at the neck and appendages and was normal.  Reflexes - The patient's reflexes were symmetrical in all extremities and she had no pathological reflexes.  Sensory - Light touch, temperature/pinprick were assessed and were symmetrical.    Coordination - The patient had normal movements in the hands and feet with no ataxia or dysmetria.  Tremor was absent.  Gait and Station - deferred.   Assessment:  Ms. DYONNA JASPERS is a 64 y.o. female with history of HTN presenting with transient confusion without memory of event.   Confusion episode - atypical episode, DDx including the following 1. TIA - acute onset, lasted 15-37min. However, non focal, no significant risk factors 2. TGA - exercise triggered, amnesic for the episode. However, duration is too short for TGA, did not asking repetitive questions 3. Seizure - confusion, ? Staring, but no hx of seizure, EEG normal, no typical seizure activity 4. Migraine equivalent - no hx of migraine, no HA reported  CT head No acute abnormality. Mild Atrophy.   MRI  No acute abnormality. Mild to moderate small vessel disease.   MRA head 1.5cm L A2 aneurysm.  MRA neck  Unremarkable   2D Echo EF 60-65%. No source of embolus   EEG excessive beta, no sz  LDL 92  HgbA1c 5.3   Lovenox 40 mg sq daily for VTE prophylaxis  No antithrombotic prior to admission, now on ASA 81 and plavix 75 for 3 weeks and then ASA alone.   Therapy  recommendations:  No therapy needs  Disposition:  Return home  Hypertension  Stable . BP goal normotensive  Hyperlipidemia   LDL 92, goal < 70  On lipitor 20  No high intensity statin due to relatively low LDL level and diagnosis not sure for TIA.  Continue statin on discharge  Other Stroke Risk Factors  Advanced age  ETOH use, advised to drink no more than 1 drink(s) a day  Other Active Problems  Sinus bradycardia, asymptomatic  Hospital day # 0   Thank you for this consultation and allowing [place] to participate in the care of this patient. Neurology will sign off. Please call with questions. Pt will follow up with stroke clinic NP at [facility] in about 4 weeks.    Marvel Plan, MD PhD Stroke Neurology 07/17/2019 4:19 PM    To contact Stroke Continuity provider, please refer to [email or web address]. After hours, contact General Neurology

## 2019-07-17 NOTE — TOC Transition Note (Signed)
Transition of Care [facility]) - CM/SW Discharge Note   Patient Details  Name: Brianna Mccoy MRN: [id number] Date of Birth: 08/13/1955  Transition of Care [facility]) CM/SW Contact:  Kermit Balo, RN Phone Number: 07/17/2019, 1:28 PM   Clinical Narrative:    Pt discharging home with self care. No f/u per PT.  Pt has supervision at home and transportation to home.  Pt denies any issues with home medications.   Final next level of care: Home/Self Care Barriers to Discharge: No Barriers Identified   Patient Goals and CMS Choice        Discharge Placement                       Discharge Plan and Services                                     Social Determinants of Health (SDOH) Interventions     Readmission Risk Interventions No flowsheet data found.

## 2019-07-17 NOTE — H&P (Signed)
History and Physical    CALYSSA ZOBRIST PZW:[id number] DOB: [birth year] DOA: 07/16/2019  PCP: Patient, No Pcp Per  Patient coming from: Home.  Chief Complaint: Memory loss.  HPI: Brianna Mccoy is a 64 y.o. female with history of hypertension presents to the ER admits in [facility] yesterday morning with having lost memory for a few minutes after she woke up.  She states that she did have ellipticals in the morning and following that her husband found that she was confused not remembering things she had been doing.  And had some staring spells.  This lasted for around 15 to 20 minutes following which she resolved.  Was brought to the ER.  Not lose function of the upper or lower extremities.  Did not have any visual symptoms or difficulty swallowing or speaking.  ED Course: In the ER patient had CT head which did not show anything acute.  EKG was showing sinus bradycardia at beats around 52 bpm labs are largely unremarkable except for mild leukopenia around 3000.  Patient was afebrile.  Tele neurology was consulted and at this time recommended admission for further work-up including MRI MRA and EEG.  Review of Systems: As per HPI, rest all negative.   Past Medical History:  Diagnosis Date   Hypertension     History reviewed. No pertinent surgical history.   reports that she has never smoked. She has never used smokeless tobacco. She reports current alcohol use. She reports that she does not use drugs.  No Known Allergies  Family History  Problem Relation Age of Onset   Diabetes Mellitus II Mother     Prior to Admission medications   Medication Sig Start Date End Date Taking? Authorizing Provider  Calcium Carbonate-Vitamin D 600-200 MG-UNIT TABS Take 1 tablet by mouth daily.   Yes [provider]  estradiol-norethindrone (ACTIVELLA) 1-0.5 MG tablet Take 1 tablet by mouth daily. 08/07/16  Yes [provider]  polyethylene glycol powder (GLYCOLAX/MIRALAX) 17  GM/SCOOP powder every other day. 01/05/18  Yes [provider]  triamterene-hydrochlorothiazide (MAXZIDE-25) 37.5-25 MG tablet Take 1 tablet by mouth daily. 08/07/16  Yes [provider]  triamterene-hydrochlorothiazide (MAXZIDE-25) 37.5-25 MG tablet Take 1 tablet by mouth daily. 05/05/19   [provider]    Physical Exam: Constitutional: Moderately built and nourished. Vitals:   07/16/19 2232 07/16/19 2322 07/17/19 0140 07/17/19 0141  BP: (!) 135/95 (!) 147/103 131/90 126/83  Pulse: (!) 53 (!) 52    Resp: 18     Temp: 98 F (36.7 C) 98.5 F (36.9 C)    TempSrc:  Oral    SpO2: 100%     Weight:  48.3 kg    Height:       Eyes: Anicteric no pallor. ENMT: No discharge from the ears eyes nose or mouth. Neck: No mass felt.  No neck rigidity. Respiratory: No rhonchi or crepitations. Cardiovascular: S1-S2 heard. Abdomen: Soft nontender bowel sounds present. Musculoskeletal: No edema. Skin: No rash. Neurologic: Alert awake oriented to time place and person.  Moves all extremities 5 x 5.  No facial asymmetry tongue is midline. Psychiatric: Appears normal per normal affect.   Labs on Admission: I have personally reviewed following labs and imaging studies  CBC: Recent Labs  Lab 07/16/19 1011  WBC 3.5*  NEUTROABS 2.2  HGB 14.5  HCT 45.3  MCV 93.6  PLT 165   Basic Metabolic Panel: Recent Labs  Lab 07/16/19 1011  NA 139  K 3.6  CL 101  CO2 27  GLUCOSE 100*  BUN 15  CREATININE 0.84  CALCIUM 9.1   GFR: Estimated Creatinine Clearance: 50.9 mL/min (by C-G formula based on SCr of 0.84 mg/dL). Liver Function Tests: Recent Labs  Lab 07/16/19 1011  AST 27  ALT 32  ALKPHOS 60  BILITOT 0.8  PROT 6.9  ALBUMIN 4.2   No results for input(s): LIPASE, AMYLASE in the last 168 hours. No results for input(s): AMMONIA in the last 168 hours. Coagulation Profile: Recent Labs  Lab 07/16/19 1011  INR 1.0   Cardiac Enzymes: No results for input(s):  CKTOTAL, CKMB, CKMBINDEX, TROPONINI in the last 168 hours. BNP (last 3 results) No results for input(s): PROBNP in the last 8760 hours. HbA1C: No results for input(s): HGBA1C in the last 72 hours. CBG: No results for input(s): GLUCAP in the last 168 hours. Lipid Profile: No results for input(s): CHOL, HDL, LDLCALC, TRIG, CHOLHDL, LDLDIRECT in the last 72 hours. Thyroid Function Tests: No results for input(s): TSH, T4TOTAL, FREET4, T3FREE, THYROIDAB in the last 72 hours. Anemia Panel: No results for input(s): VITAMINB12, FOLATE, FERRITIN, TIBC, IRON, RETICCTPCT in the last 72 hours. Urine analysis:    Component Value Date/Time   COLORURINE YELLOW 07/16/2019 1030   APPEARANCEUR CLEAR 07/16/2019 1030   LABSPEC <1.005 (L) 07/16/2019 1030   PHURINE 6.0 07/16/2019 1030   GLUCOSEU NEGATIVE 07/16/2019 1030   HGBUR NEGATIVE 07/16/2019 1030   BILIRUBINUR NEGATIVE 07/16/2019 1030   KETONESUR NEGATIVE 07/16/2019 1030   PROTEINUR NEGATIVE 07/16/2019 1030   NITRITE NEGATIVE 07/16/2019 1030   LEUKOCYTESUR NEGATIVE 07/16/2019 1030   Sepsis Labs: @LABRCNTIP (procalcitonin:4,lacticidven:4) ) Recent Results (from the past 240 hour(s))  SARS Coronavirus 2 by RT PCR (hospital order, performed in [facility] hospital lab) Nasopharyngeal Nasopharyngeal Swab     Status: None   Collection Time: 07/16/19  2:05 PM   Specimen: Nasopharyngeal Swab  Result Value Ref Range Status   SARS Coronavirus 2 NEGATIVE NEGATIVE Final    Comment: (NOTE) SARS-CoV-2 target nucleic acids are NOT DETECTED.  The SARS-CoV-2 RNA is generally detectable in upper and lower respiratory specimens during the acute phase of infection. The lowest concentration of SARS-CoV-2 viral copies this assay can detect is 250 copies / mL. A negative result does not preclude SARS-CoV-2 infection and should not be used as the sole basis for treatment or other patient management decisions.  A negative result may occur with improper specimen  collection / handling, submission of specimen other than nasopharyngeal swab, presence of viral mutation(s) within the areas targeted by this assay, and inadequate number of viral copies (<250 copies / mL). A negative result must be combined with clinical observations, patient history, and epidemiological information.  Fact Sheet for Patients:   09/16/19  Fact Sheet for Healthcare Providers: BoilerBrush.com.cy  This test is not yet approved or  cleared by the https://pope.com/ FDA and has been authorized for detection and/or diagnosis of SARS-CoV-2 by FDA under an Emergency Use Authorization (EUA).  This EUA will remain in effect (meaning this test can be used) for the duration of the COVID-19 declaration under Section 564(b)(1) of the Act, 21 U.S.C. section 360bbb-3(b)(1), unless the authorization is terminated or revoked sooner.  Performed at [facility], [address] [place], [place]      Radiological Exams on Admission: CT HEAD WO CONTRAST  Result Date: 07/16/2019 CLINICAL DATA:  Confusion EXAM: CT HEAD WITHOUT CONTRAST TECHNIQUE: Contiguous axial images were obtained from the  base of the skull through the vertex without intravenous contrast. COMPARISON:  None. FINDINGS: Brain: Mild diffuse atrophy. No evidence of acute infarction, hemorrhage, hydrocephalus, extra-axial collection or mass lesion/mass effect. Vascular: No hyperdense vessel or unexpected calcification. Skull: Normal. Negative for fracture or focal lesion. Sinuses/Orbits: No acute finding. Other: None. IMPRESSION: 1. Negative for bleed or other acute intracranial process. 2. Mild diffuse atrophy. Electronically Signed   By: Corlis Leak M.D.   On: 07/16/2019 09:28    EKG: Independently reviewed.  Sinus bradycardia rate around 52 bpm.  Assessment/Plan Principal Problem:   Transient global amnesia Active Problems:   Essential hypertension     1. Transient global amnesia -patient's diagnosis likely transient global amnesia.  Other differentials include possible stroke for which neurologist has recommended MRI brain and MRA of the head and neck.  And EEG.  Based on which we will have further plans.  Patient passed swallow. 2. Sinus bradycardia appears to be asymptomatic.  Patient is not on any rate limiting medications.  Check TSH monitor in telemetry. 3. Hypertension takes diuretics which we will hold off for now until we make sure patient does not have a stroke.  We will keep patient on as needed IV hydralazine for systolic more than 220 and diastolic more than 120.   DVT prophylaxis: Lovenox. Code Status: Full code. Family Communication: Discussed with patient. Disposition Plan: Home. Consults called: Neurologist. Admission status: Observation   Eduard Clos MD [facility] Pager [phone].  If 7PM-7AM, please contact night-coverage [email or web address] Password [facility]  07/17/2019, 1:49 AM

## 2019-07-17 NOTE — Progress Notes (Signed)
  Echocardiogram 2D Echocardiogram has been performed.  Brianna Mccoy 07/17/2019, 1:53 PM

## 2019-07-17 NOTE — Progress Notes (Signed)
OT Cancellation Note  Patient Details Name: Brianna Mccoy MRN: [id number] DOB: 1955-10-13   Cancelled Treatment:    Reason Eval/Treat Not Completed: OT screened, no needs identified, will sign off.  Delayna Sparlin/[name] Shary Lamos 07/17/2019, 12:34 PM

## 2019-07-17 NOTE — Progress Notes (Signed)
EEG complete - results pending 

## 2019-07-17 NOTE — Progress Notes (Signed)
SLP Cancellation Note  Patient Details Name: Brianna Mccoy MRN: [id number] DOB: 1955-08-09   Cancelled treatment:       Reason Eval/Treat Not Completed: SLP screened, no needs identified, will sign off; MRI head 07/17/19 negative; EEG pending; pt/husband stated all symptoms (primarily amnesia) resolved within 20 min of occurring.  ST will s/o at this time.   Tressie Stalker, M.S., CCC-SLP 07/17/2019, 10:25 AM

## 2019-07-17 NOTE — Discharge Summary (Signed)
Physician Discharge Summary  Brianna CrazierCordelia B Mccoy WUJ:[id number] DOB: 11/04/1955 DOA: 07/16/2019  PCP: Patient, No Pcp Per  Admit date: 07/16/2019 Discharge date: 07/17/2019  Admitted From: Home Disposition:  Home  Recommendations for Outpatient Follow-up:  Follow up with PCP in 1-2 weeks Please obtain BMP/CBC in one week your next doctors visit.  ASA/Plavix for 3 weeks followed by Daily ASA. Follow up outpatient Neuro in 3-4 weeks.   Discharge Condition: Stable CODE STATUS: Full  Diet recommendation: 2gNa  Brief/Interim Summary: 64 year old with history of essential hypertension [facility] for forgetfulness for about 15 minutes on the day of admission.  In the ER CT head negative, EKG showed HR 52.  Admitted for routine work-up. MRI/MRA was negative. EEG was negative. Neurology recommended DAPT x 3 weeks followed by ASA and outpatient follow up Stable for discharge.      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   Body mass index is 18.28 kg/m.    Discharge Diagnoses:  Principal Problem:   Transient global amnesia Active Problems:   Essential hypertension      Consultations: Neurology  Subjective: No complaints, feels ok.  Husband at bedside confirmed she has back to her baseline.   Discharge Exam: Vitals:   07/17/19 0805 07/17/19 1143  BP: 131/85 (!) 141/87  Pulse: (!) 51 (!) 55  Resp: 20 20  Temp: 97.8 F (36.6 C)  98.6 F (37 C)  SpO2: 99% 98%   Vitals:   07/17/19 0141 07/17/19 0344 07/17/19 0805 07/17/19 1143  BP: 126/83 116/87 131/85 (!) 141/87  Pulse:  (!) 50 (!) 51 (!) 55  Resp:   20 20  Temp:  98 F (36.7 C) 97.8 F (36.6 C) 98.6 F (37 C)  TempSrc:  Oral Oral Oral  SpO2:  99% 99% 98%  Weight:      Height:        General: Pt is alert, awake, not in acute distress Cardiovascular: RRR, S1/S2 +, no rubs, no gallops Respiratory: CTA bilaterally, no wheezing, no rhonchi Abdominal: Soft, NT, ND, bowel sounds + Extremities: no edema, no cyanosis  Discharge Instructions    Allergies as of 07/17/2019   No Known Allergies      Medication List     TAKE these medications    aspirin 81 MG EC tablet Take 1 tablet (81 mg total) by mouth daily. Swallow whole.   atorvastatin 20 MG tablet Commonly known as: LIPITOR Take 1 tablet (20 mg total) by mouth daily.   Calcium Carbonate-Vitamin D 600-200 MG-UNIT Tabs Take 1 tablet by mouth daily.   clopidogrel 75 MG tablet Commonly known as: PLAVIX Take 1 tablet (75 mg total) by mouth daily for 21 days.   estradiol-norethindrone 1-0.5 MG tablet Commonly known as: ACTIVELLA Take 1 tablet by mouth daily.   polyethylene glycol powder 17 GM/SCOOP powder Commonly known as: GLYCOLAX/MIRALAX Take 17 g by mouth daily as needed for mild constipation.   triamterene-hydrochlorothiazide 37.5-25 MG tablet Commonly known as:  MAXZIDE-25 Take 0.5 tablets by mouth daily.        Follow-up Information     [facility]. Schedule an appointment as soon as possible for a visit in 3 week(s).   Contact information: [address]     [address] [place] [address] [phone]               No Known Allergies  You were cared for by a hospitalist during your hospital stay. If you have any questions about your discharge medications or the care you received while you were in the hospital after you are  discharged, you can call the unit and asked to speak with the hospitalist on call if the hospitalist that took care of you is not available. Once you are discharged, your primary care physician will handle any further medical issues. Please note that no refills for any discharge medications will be authorized once you are discharged, as it is imperative that you return to your primary care physician (or establish a relationship with a primary care physician if you do not have one) for your aftercare needs so that they can reassess your need for medications and monitor your lab values.   Procedures/Studies: EEG  Result Date: 07/17/2019 Charlsie Quest, MD     07/17/2019 11:16 AM Patient Name: Brianna Mccoy MRN: [id number] Epilepsy Attending: Charlsie Quest Referring Physician/Provider: Dr Midge Minium Date: 09/17/2019 Duration: 26.59 mins Patient history: 64yo F with transient global amnesia. EEG to evaluate for seizure. Level of alertness: Awake AEDs during EEG study: None Technical aspects: This EEG study was done with scalp electrodes positioned according to the 10-20 International system of electrode placement. Electrical activity was acquired at a sampling rate of 500Hz  and reviewed with a high frequency filter of 70Hz  and a low frequency filter of 1Hz . EEG data were recorded continuously and digitally stored. Description: No clear posterior dominant rhythm was seen. There is 13-15 Hz beta activity with irregular morphology distributed symmetrically and diffusely. Physiology photic driving was seen during photic stimulation.  Hyperventilation was not performed.   ABNORMALITY -Excessive beta, generalized IMPRESSION: This study is within normal limits. No seizures or epileptiform discharges were seen throughout the recording. The excessive beta activity seen in the background is most likely due to the effect of benzodiazepine and is a benign EEG pattern.   CT HEAD WO  CONTRAST  Result Date: 07/16/2019 CLINICAL DATA:  Confusion EXAM: CT HEAD WITHOUT CONTRAST TECHNIQUE: Contiguous axial images were obtained from the base of the skull through the vertex without intravenous contrast. COMPARISON:  None. FINDINGS: Brain: Mild diffuse atrophy. No evidence of acute infarction, hemorrhage, hydrocephalus, extra-axial collection or mass lesion/mass effect. Vascular: No hyperdense vessel or unexpected calcification. Skull: Normal. Negative for fracture or focal lesion. Sinuses/Orbits: No acute finding. Other: None. IMPRESSION: 1. Negative for bleed or other acute intracranial process. 2. Mild diffuse atrophy. Electronically Signed   By: M.D.   On: 07/16/2019 09:28   MR ANGIO HEAD WO CONTRAST  Result Date: 07/17/2019 CLINICAL DATA:  Acute confusion. EXAM: MRI HEAD WITHOUT AND WITH CONTRAST MRA HEAD WITHOUT CONTRAST MRA NECK WITHOUT CONTRAST TECHNIQUE: Multiplanar, multiecho pulse sequences of the brain and surrounding structures were obtained without and with intravenous contrast. Angiographic images of the Circle of Willis were obtained using MRA technique without intravenous contrast. Angiographic images of the neck were obtained using MRA technique without intravenous contrast. Carotid stenosis measurements (when applicable) are obtained  utilizing NASCET criteria, using the distal internal carotid diameter as the denominator. CONTRAST:  60mL GADAVIST GADOBUTROL 1 MMOL/ML IV SOLN COMPARISON:  Head CT 07/16/2019 FINDINGS: MRI HEAD FINDINGS Brain: There is no evidence of acute infarct, intracranial hemorrhage, mass, midline shift, or extra-axial fluid collection. Mild cerebral atrophy is within normal limits for age. Patchy T2 hyperintensities in the cerebral white matter bilaterally are nonspecific but compatible with mild-to-moderate chronic small vessel ischemic disease. No abnormal enhancement is identified. Vascular: Major intracranial vascular flow voids are preserved.  Skull and upper cervical spine: Unremarkable bone marrow signal. Sinuses/Orbits: Right cataract extraction. Paranasal sinuses and mastoid air cells are clear. Other: None. MRA HEAD FINDINGS The visualized distal vertebral arteries are patent to the basilar with the right being moderately dominant. Patent PICAs, AICAs, and SCAs are seen bilaterally. The basilar artery is widely patent. There is a small right posterior communicating artery. Both PCAs are patent without evidence of a significant proximal stenosis. The internal carotid arteries are widely patent from skull base to carotid termini. ACAs and MCAs are patent without evidence of a proximal branch occlusion or significant proximal stenosis. There is a 1.5 mm left ACA A2 segment aneurysm. MRA NECK FINDINGS The aortic arch and left common carotid artery origin were not imaged. The included portions of the common carotid arteries are widely patent without evidence of stenosis or dissection. Both internal carotid arteries are patent with flow artifact in the carotid bulbs but no suspected significant stenosis or evidence of dissection. The vertebral arteries are patent with antegrade flow bilaterally. The right vertebral artery is dominant. There is no evidence of a significant vertebral artery stenosis or dissection. IMPRESSION: 1. No acute intracranial abnormality. 2. Mild-to-moderate chronic small vessel ischemic disease. 3. No major intracranial arterial occlusion or significant proximal stenosis. 4. 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 5. Negative neck MRA. Electronically Signed   By: Sebastian Ache M.D.   On: 07/17/2019 05:45   MR ANGIO NECK WO CONTRAST  Result Date: 07/17/2019 CLINICAL DATA:  Acute confusion. EXAM: MRI HEAD WITHOUT AND WITH CONTRAST MRA HEAD WITHOUT CONTRAST MRA NECK WITHOUT CONTRAST TECHNIQUE: Multiplanar, multiecho pulse sequences of the brain and surrounding structures were obtained without and with intravenous contrast. Angiographic  images of the Circle of Willis were obtained using MRA technique without intravenous contrast. Angiographic images of the neck were obtained using MRA technique without intravenous contrast. Carotid stenosis measurements (when applicable) are obtained utilizing NASCET criteria, using the distal internal carotid diameter as the denominator. CONTRAST:  25mL GADAVIST GADOBUTROL 1 MMOL/ML IV SOLN COMPARISON:  Head CT 07/16/2019 FINDINGS: MRI HEAD FINDINGS Brain: There is no evidence of acute infarct, intracranial hemorrhage, mass, midline shift, or extra-axial fluid collection. Mild cerebral atrophy is within normal limits for age. Patchy T2 hyperintensities in the cerebral white matter bilaterally are nonspecific but compatible with mild-to-moderate chronic small vessel ischemic disease. No abnormal enhancement is identified. Vascular: Major intracranial vascular flow voids are preserved. Skull and upper cervical spine: Unremarkable bone marrow signal. Sinuses/Orbits: Right cataract extraction. Paranasal sinuses and mastoid air cells are clear. Other: None. MRA HEAD FINDINGS The visualized distal vertebral arteries are patent to the basilar with the right being moderately dominant. Patent PICAs, AICAs, and SCAs are seen bilaterally. The basilar artery is widely patent. There is a small right posterior communicating artery. Both PCAs are patent without evidence of a significant proximal stenosis. The internal carotid arteries are widely patent from skull base to carotid termini. ACAs and MCAs are  patent without evidence of a proximal branch occlusion or significant proximal stenosis. There is a 1.5 mm left ACA A2 segment aneurysm. MRA NECK FINDINGS The aortic arch and left common carotid artery origin were not imaged. The included portions of the common carotid arteries are widely patent without evidence of stenosis or dissection. Both internal carotid arteries are patent with flow artifact in the carotid bulbs but no  suspected significant stenosis or evidence of dissection. The vertebral arteries are patent with antegrade flow bilaterally. The right vertebral artery is dominant. There is no evidence of a significant vertebral artery stenosis or dissection. IMPRESSION: 1. No acute intracranial abnormality. 2. Mild-to-moderate chronic small vessel ischemic disease. 3. No major intracranial arterial occlusion or significant proximal stenosis. 4. 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 5. Negative neck MRA. Electronically Signed   By: Sebastian Ache M.D.   On: 07/17/2019 05:45   MR BRAIN W WO CONTRAST  Result Date: 07/17/2019 CLINICAL DATA:  Acute confusion. EXAM: MRI HEAD WITHOUT AND WITH CONTRAST MRA HEAD WITHOUT CONTRAST MRA NECK WITHOUT CONTRAST TECHNIQUE: Multiplanar, multiecho pulse sequences of the brain and surrounding structures were obtained without and with intravenous contrast. Angiographic images of the Circle of Willis were obtained using MRA technique without intravenous contrast. Angiographic images of the neck were obtained using MRA technique without intravenous contrast. Carotid stenosis measurements (when applicable) are obtained utilizing NASCET criteria, using the distal internal carotid diameter as the denominator. CONTRAST:  19mL GADAVIST GADOBUTROL 1 MMOL/ML IV SOLN COMPARISON:  Head CT 07/16/2019 FINDINGS: MRI HEAD FINDINGS Brain: There is no evidence of acute infarct, intracranial hemorrhage, mass, midline shift, or extra-axial fluid collection. Mild cerebral atrophy is within normal limits for age. Patchy T2 hyperintensities in the cerebral white matter bilaterally are nonspecific but compatible with mild-to-moderate chronic small vessel ischemic disease. No abnormal enhancement is identified. Vascular: Major intracranial vascular flow voids are preserved. Skull and upper cervical spine: Unremarkable bone marrow signal. Sinuses/Orbits: Right cataract extraction. Paranasal sinuses and mastoid air cells are  clear. Other: None. MRA HEAD FINDINGS The visualized distal vertebral arteries are patent to the basilar with the right being moderately dominant. Patent PICAs, AICAs, and SCAs are seen bilaterally. The basilar artery is widely patent. There is a small right posterior communicating artery. Both PCAs are patent without evidence of a significant proximal stenosis. The internal carotid arteries are widely patent from skull base to carotid termini. ACAs and MCAs are patent without evidence of a proximal branch occlusion or significant proximal stenosis. There is a 1.5 mm left ACA A2 segment aneurysm. MRA NECK FINDINGS The aortic arch and left common carotid artery origin were not imaged. The included portions of the common carotid arteries are widely patent without evidence of stenosis or dissection. Both internal carotid arteries are patent with flow artifact in the carotid bulbs but no suspected significant stenosis or evidence of dissection. The vertebral arteries are patent with antegrade flow bilaterally. The right vertebral artery is dominant. There is no evidence of a significant vertebral artery stenosis or dissection. IMPRESSION: 1. No acute intracranial abnormality. 2. Mild-to-moderate chronic small vessel ischemic disease. 3. No major intracranial arterial occlusion or significant proximal stenosis. 4. 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 5. Negative neck MRA. Electronically Signed   By: Sebastian Ache M.D.   On: 07/17/2019 05:45   ECHOCARDIOGRAM COMPLETE  Result Date: 07/17/2019    ECHOCARDIOGRAM REPORT   Patient Name:   Brianna Mccoy Date of Exam: 07/17/2019 Medical Rec #:  [id number]        Height:       64.0 in Accession #:    [id number]       Weight:       106.5 lb Date of Birth:  [birth year]        BSA:          1.497 m Patient Age:    63 years         BP:           141/87 mmHg Patient Gender: F                HR:           55 bpm. Exam Location:  Inpatient Procedure: 2D Echo Indications:    TIA 435.9   History:        Patient has no prior history of Echocardiogram examinations.                 Risk Factors:Hypertension.  Sonographer:    Delcie Roch Referring Phys: [id number] JINDONG XU IMPRESSIONS  1. Left ventricular ejection fraction, by estimation, is 60 to 65%. The left ventricle has normal function. The left ventricle has no regional wall motion abnormalities. Left ventricular diastolic parameters were normal.  2. Right ventricular systolic function is normal. The right ventricular size is normal. There is normal pulmonary artery systolic pressure.  3. The mitral valve is normal in structure. Mild mitral valve regurgitation. No evidence of mitral stenosis.  4. The aortic valve is tricuspid. Aortic valve regurgitation is mild. Mild aortic valve sclerosis is present, with no evidence of aortic valve stenosis.  5. The inferior vena cava is normal in size with greater than 50% respiratory variability, suggesting right atrial pressure of 3 mmHg. FINDINGS  Left Ventricle: Left ventricular ejection fraction, by estimation, is 60 to 65%. The left ventricle has normal function. The left ventricle has no regional wall motion abnormalities. The left ventricular internal cavity size was normal in size. There is  no left ventricular hypertrophy. Left ventricular diastolic parameters were normal. Right Ventricle: The right ventricular size is normal.Right ventricular systolic function is normal. There is normal pulmonary artery systolic pressure. The tricuspid regurgitant velocity is 2.62 m/s, and with an assumed right atrial pressure of 3 mmHg, the estimated right ventricular systolic pressure is 30.5 mmHg. Left Atrium: Left atrial size was normal in size. Right Atrium: Right atrial size was normal in size. Pericardium: There is no evidence of pericardial effusion. Mitral Valve: The mitral valve is normal in structure. Normal mobility of the mitral valve leaflets. Mild mitral valve regurgitation. No evidence of mitral  valve stenosis. Tricuspid Valve: The tricuspid valve is normal in structure. Tricuspid valve regurgitation is mild . No evidence of tricuspid stenosis. Aortic Valve: The aortic valve is tricuspid. Aortic valve regurgitation is mild. Mild aortic valve sclerosis is present, with no evidence of aortic valve stenosis. Pulmonic Valve: The pulmonic valve was normal in structure. Pulmonic valve regurgitation is trivial. No evidence of pulmonic stenosis. Aorta: The aortic root is normal in size and structure. Venous: The inferior vena cava is normal in size with greater than 50% respiratory variability, suggesting right atrial pressure of 3 mmHg. IAS/Shunts: No atrial level shunt detected by color flow Doppler.  LEFT VENTRICLE PLAX 2D LVIDd:         3.80 cm  Diastology LVIDs:         2.30 cm  LV e' lateral:   10.60 cm/s LV PW:  0.90 cm  LV E/e' lateral: 9.8 LV IVS:        0.70 cm  LV e' medial:    9.14 cm/s LVOT diam:     2.10 cm  LV E/e' medial:  11.4 LV SV:         79 LV SV Index:   53 LVOT Area:     3.46 cm  RIGHT VENTRICLE             IVC RV S prime:     17.40 cm/s  IVC diam: 1.00 cm TAPSE (M-mode): 2.8 cm LEFT ATRIUM             Index       RIGHT ATRIUM           Index LA diam:        3.00 cm 2.00 cm/m  RA Area:     12.90 cm LA Vol (A2C):   41.5 ml 27.73 ml/m RA Volume:   29.00 ml  19.38 ml/m LA Vol (A4C):   30.8 ml 20.58 ml/m LA Biplane Vol: 36.7 ml 24.52 ml/m  AORTIC VALVE LVOT Vmax:   82.20 cm/s LVOT Vmean:  53.900 cm/s LVOT VTI:    0.227 m  AORTA Ao Root diam: 2.80 cm MITRAL VALVE                TRICUSPID VALVE MV Area (PHT): 2.77 cm     TR Peak grad:   27.5 mmHg MV Decel Time: 274 msec     TR Vmax:        262.00 cm/s MV E velocity: 104.00 cm/s MV A velocity: 56.60 cm/s   SHUNTS MV E/A ratio:  1.84         Systemic VTI:  0.23 m                             Systemic Diam: 2.10 cm Olga Millers MD Electronically signed by Olga Millers MD Signature Date/Time: 07/17/2019/3:32:01 PM    Final        The results of significant diagnostics from this hospitalization (including imaging, microbiology, ancillary and laboratory) are listed below for reference.     Microbiology: Recent Results (from the past 240 hour(s))  SARS Coronavirus 2 by RT PCR (hospital order, performed in [facility] hospital lab) Nasopharyngeal Nasopharyngeal Swab     Status: None   Collection Time: 07/16/19  2:05 PM   Specimen: Nasopharyngeal Swab  Result Value Ref Range Status   SARS Coronavirus 2 NEGATIVE NEGATIVE Final    Comment: (NOTE) SARS-CoV-2 target nucleic acids are NOT DETECTED.  The SARS-CoV-2 RNA is generally detectable in upper and lower respiratory specimens during the acute phase of infection. The lowest concentration of SARS-CoV-2 viral copies this assay can detect is 250 copies / mL. A negative result does not preclude SARS-CoV-2 infection and should not be used as the sole basis for treatment or other patient management decisions.  A negative result may occur with improper specimen collection / handling, submission of specimen other than nasopharyngeal swab, presence of viral mutation(s) within the areas targeted by this assay, and inadequate number of viral copies (<250 copies / mL). A negative result must be combined with clinical observations, patient history, and epidemiological information.  Fact Sheet for Patients:   BoilerBrush.com.cy  Fact Sheet for Healthcare Providers: https://pope.com/  This test is not yet approved or  cleared by the [place] FDA and has been  authorized for detection and/or diagnosis of SARS-CoV-2 by FDA under an Emergency Use Authorization (EUA).  This EUA will remain in effect (meaning this test can be used) for the duration of the COVID-19 declaration under Section 564(b)(1) of the Act, 21 U.S.C. section 360bbb-3(b)(1), unless the authorization is terminated or revoked sooner.  Performed at [facility], [address] [place], [place] [address]      Labs: BNP (last 3 results) No results for input(s): BNP in the last 8760 hours. Basic Metabolic Panel: Recent Labs  Lab 07/16/19 1011 07/17/19 0213 07/17/19 1038  NA 139  --  141  K 3.6  --  3.8  CL 101  --  106  CO2 27  --  24  GLUCOSE 100*  --  93  BUN 15  --  9  CREATININE 0.84 0.95 0.85  CALCIUM 9.1  --  9.3   Liver Function Tests: Recent Labs  Lab 07/16/19 1011  AST 27  ALT 32  ALKPHOS 60  BILITOT 0.8  PROT 6.9  ALBUMIN 4.2   No results for input(s): LIPASE, AMYLASE in the last 168 hours. No results for input(s): AMMONIA in the last 168 hours. CBC: Recent Labs  Lab 07/16/19 1011 07/17/19 0213  WBC 3.5* 4.0  NEUTROABS 2.2  --   HGB 14.5 13.3  HCT 45.3 41.4  MCV 93.6 94.7  PLT 165 152   Cardiac Enzymes: No results for input(s): CKTOTAL, CKMB, CKMBINDEX, TROPONINI in the last 168 hours. BNP: Invalid input(s): POCBNP CBG: No results for input(s): GLUCAP in the last 168 hours. D-Dimer No results for input(s): DDIMER in the last 72 hours. Hgb A1c Recent Labs    07/17/19 0213  HGBA1C 5.3   Lipid Profile Recent Labs    07/17/19 0213  CHOL 194  HDL 90  LDLCALC 92  TRIG 61  CHOLHDL 2.2   Thyroid function studies Recent Labs    07/17/19 1033  TSH 5.414*   Anemia work up No results for input(s): VITAMINB12, FOLATE, FERRITIN, TIBC, IRON, RETICCTPCT in the last 72 hours. Urinalysis    Component Value Date/Time   COLORURINE YELLOW 07/16/2019 1030   APPEARANCEUR CLEAR 07/16/2019 1030   LABSPEC <1.005 (L) 07/16/2019 1030   PHURINE 6.0 07/16/2019 1030   GLUCOSEU NEGATIVE 07/16/2019 1030   HGBUR NEGATIVE 07/16/2019 1030   BILIRUBINUR NEGATIVE 07/16/2019 1030   KETONESUR NEGATIVE 07/16/2019 1030   PROTEINUR NEGATIVE 07/16/2019 1030   NITRITE NEGATIVE 07/16/2019 1030   LEUKOCYTESUR NEGATIVE 07/16/2019 1030   Sepsis Labs Invalid input(s): PROCALCITONIN,  WBC,   LACTICIDVEN Microbiology Recent Results (from the past 240 hour(s))  SARS Coronavirus 2 by RT PCR (hospital order, performed in [facility] hospital lab) Nasopharyngeal Nasopharyngeal Swab     Status: None   Collection Time: 07/16/19  2:05 PM   Specimen: Nasopharyngeal Swab  Result Value Ref Range Status   SARS Coronavirus 2 NEGATIVE NEGATIVE Final    Comment: (NOTE) SARS-CoV-2 target nucleic acids are NOT DETECTED.  The SARS-CoV-2 RNA is generally detectable in upper and lower respiratory specimens during the acute phase of infection. The lowest concentration of SARS-CoV-2 viral copies this assay can detect is 250 copies / mL. A negative result does not preclude SARS-CoV-2 infection and should not be used as the sole basis for treatment or other patient management decisions.  A negative result may occur with improper specimen collection / handling, submission of specimen other than nasopharyngeal swab, presence of viral mutation(s) within the  areas targeted by this assay, and inadequate number of viral copies (<250 copies / mL). A negative result must be combined with clinical observations, patient history, and epidemiological information.  Fact Sheet for Patients:   BoilerBrush.com.cy  Fact Sheet for Healthcare Providers: https://pope.com/  This test is not yet approved or  cleared by the [place] FDA and has been authorized for detection and/or diagnosis of SARS-CoV-2 by FDA under an Emergency Use Authorization (EUA).  This EUA will remain in effect (meaning this test can be used) for the duration of the COVID-19 declaration under Section 564(b)(1) of the Act, 21 U.S.C. section 360bbb-3(b)(1), unless the authorization is terminated or revoked sooner.  Performed at [facility], [address] [place], [place] [address]      Time coordinating discharge:  I have spent 35 minutes face to face with the patient  and on the ward discussing the patients care, assessment, plan and disposition with other care givers. >50% of the time was devoted counseling the patient about the risks and benefits of treatment/Discharge disposition and coordinating care.   SIGNED:   Dimple Nanas, MD  [facility] 07/17/2019, 4:18 PM   If 7PM-7AM, please contact night-coverage

## 2019-07-21 ENCOUNTER — Telehealth: Payer: Self-pay | Admitting: Neurology

## 2019-07-21 ENCOUNTER — Encounter: Payer: Self-pay | Admitting: Adult Health

## 2019-07-21 NOTE — Telephone Encounter (Signed)
Patient called after-hours call center regarding her new medication Plavix.  She states that she was put on Plavix during her recent hospitalization, she had extensive testing done and feels back to baseline but was put on cholesterol medication and Plavix.  She has noted smaller areas of bruising on her forearms on the inner sides, no actual swelling or confluent bruises or other major bruises elsewhere.  She does report easy bruising.  She seems to tolerate it otherwise.  She is okay with continuing the cholesterol medicine as well.  She has not yet been seen in this office and I explained to her that it is sometimes difficult to make exact recommendations based on prior hospital records and verbal report only.  I did ask the patient for now to alternate the Plavix, she has not taken it today, she is therefore going to take a dose tomorrow and skip day after tomorrow.  I did advise her that I would send a message to our stroke specialist and his nurse practitioner so they can review during normal office hours and weigh in; she is welcome to call back if she has any additional questions, she is scheduled to see Shanda Bumps next month.  She demonstrated understanding and agreement.

## 2019-07-24 NOTE — Telephone Encounter (Signed)
Thank you for update.  Recent hospitalization seen by Dr. Roda Shutters for transient confusion event possibly TIA vs seizure vs migraine equivalent and placed on 3 weeks DAPT and then aspirin alone.  If bruising is excessive, Plavix may need to be discontinued and be on aspirin alone but if only mild bruising, would recommend continue DAPT for total of 3-week duration as bruising will likely subside or decrease once Plavix course completed.  Dr. Pearlean Brownie, do you agree or do you have any other recommendations?  Please let me know and we will call patient back.  Thank you.

## 2019-07-24 NOTE — Telephone Encounter (Signed)
Agree with plan as stated.  

## 2019-08-15 ENCOUNTER — Ambulatory Visit: Payer: Medicare PPO | Admitting: Adult Health

## 2019-08-15 ENCOUNTER — Encounter: Payer: Self-pay | Admitting: Adult Health

## 2019-08-15 ENCOUNTER — Other Ambulatory Visit: Payer: Self-pay

## 2019-08-15 VITALS — BP 140/86 | HR 55 | Ht 64.0 in | Wt 105.0 lb

## 2019-08-15 DIAGNOSIS — I10 Essential (primary) hypertension: Secondary | ICD-10-CM

## 2019-08-15 DIAGNOSIS — R410 Disorientation, unspecified: Secondary | ICD-10-CM | POA: Diagnosis not present

## 2019-08-15 DIAGNOSIS — E785 Hyperlipidemia, unspecified: Secondary | ICD-10-CM | POA: Diagnosis not present

## 2019-08-15 NOTE — Patient Instructions (Signed)
Continue aspirin 81 mg daily  and atorvastatin for secondary stroke prevention  Continue to follow up with PCP regarding cholesterol and blood pressure management  Maintain strict control of hypertension with blood pressure goal below 130/90, diabetes with hemoglobin A1c goal below 6.5% and cholesterol with LDL cholesterol (bad cholesterol) goal below 70 mg/dL.       Followup in the future with me in 6 months or call earlier if needed       Thank you for coming to see [place] at [facility]. I hope we have been able to provide you high quality care today.  You may receive a patient satisfaction survey over the next few weeks. We would appreciate your feedback and comments so that we may continue to improve ourselves and the health of our patients.

## 2019-08-15 NOTE — Progress Notes (Signed)
[facility] [address] [place]. [place] [address] [phone]       HOSPITAL FOLLOW UP NOTE  Ms. CADEE AGRO Date of Birth:  Jul 12, 1955 Medical Record Number:  [id number]   Reason for Referral:  hospital stroke follow up    SUBJECTIVE:   CHIEF COMPLAINT:  Chief Complaint  Patient presents with  . Hospitalization Follow-up    rm 9 here for a f/u on apossible TIA. Pt is having no new sx.  . room 9    HPI:   Ms. ANDREINA OUTTEN is a 64 y.o. female with history of HTN  who presented on 07/16/2019 with transient confusion without memory of event.   Atypical confusional episode with potential Ddx including TIA vs TGA vs seizure vs migraine equivalent.  CT head and MRI brain unremarkable.  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  MRA neck unremarkable.  Recommended DAPT for 3 weeks and then aspirin alone due to potential TIA episode.  HTN stable.  LDL 92 initiate atorvastatin 20 mg daily.  No history or evidence of DM with A1c 5.3.  Other stroke risk factors include advanced age and EtOH use.  No prior stroke history.  Discharged home in stable condition without therapy needs.  Confusion episode - atypical episode, DDx including the following 1. TIA - acute onset, lasted 15-40min. However, non focal, no significant risk factors 2. TGA - exercise triggered, amnesic for the episode. However, duration is too short for TGA, did not asking repetitive questions 3. Seizure - confusion, ? Staring, but no hx of seizure, EEG normal, no typical seizure activity 4. Migraine equivalent - no hx of migraine, no HA reported  CT head No acute abnormality. Mild Atrophy.   MRI  No acute abnormality. Mild to moderate small vessel disease.   MRA head 1.5cm L A2 aneurysm.  MRA neck  Unremarkable   2D Echo EF 60-65%. No source of embolus   EEG excessive beta, no sz  LDL 92  HgbA1c 5.3   Lovenox 40 mg sq daily for VTE prophylaxis  No antithrombotic prior to admission,  now on ASA 81 and plavix 75 for 3 weeks and then ASA alone.   Therapy recommendations:  No therapy needs  Disposition:  Return home  Today, 08/15/2019, Ms. Leipold is being seen for hospital follow-up.  Has been stable without reoccurring confusional events or episodes.  Denies new stroke/TIA symptoms  Completed 3 weeks DAPT and remains on aspirin alone with mild bruising but no bleeding.  Remains on atorvastatin 20 mg daily without myalgias.  Blood pressure today 140/86 -monitors at home and typically 100-120/80s.  She continues to stay active exercising daily and working part-time as a IT sales professional.  No concerns at this time        ROS:   14 system review of systems performed and negative with exception of no concerns  PMH:  Past Medical History:  Diagnosis Date  . Hypertension     PSH: No past surgical history on file.  Social History:  Social History   Socioeconomic History  . Marital status: Married    Spouse name: Not on file  . Number of children: Not on file  . Years of education: Not on file  . Highest education level: Not on file  Occupational History  . Not on file  Tobacco Use  . Smoking status: Never Smoker  . Smokeless tobacco: Never Used  Substance and Sexual Activity  . Alcohol use: Yes  Comment: socail  . Drug use: Never  . Sexual activity: Not on file  Other Topics Concern  . Not on file  Social History Narrative  . Not on file   Social Determinants of Health   Financial Resource Strain:   . Difficulty of Paying Living Expenses:   Food Insecurity:   . Worried About Programme researcher, broadcasting/film/video in the Last Year:   . Barista in the Last Year:   Transportation Needs:   . Freight forwarder (Medical):   Marland Kitchen Lack of Transportation (Non-Medical):   Physical Activity:   . Days of Exercise per Week:   . Minutes of Exercise per Session:   Stress:   . Feeling of Stress :   Social Connections:   . Frequency of Communication with Friends  and Family:   . Frequency of Social Gatherings with Friends and Family:   . Attends Religious Services:   . Active Member of Clubs or Organizations:   . Attends Banker Meetings:   Marland Kitchen Marital Status:   Intimate Partner Violence:   . Fear of Current or Ex-Partner:   . Emotionally Abused:   Marland Kitchen Physically Abused:   . Sexually Abused:     Family History:  Family History  Problem Relation Age of Onset  . Diabetes Mellitus II Mother     Medications:   Current Outpatient Medications on File Prior to Visit  Medication Sig Dispense Refill  . aspirin EC 81 MG EC tablet Take 1 tablet (81 mg total) by mouth daily. Swallow whole. 30 tablet 11  . atorvastatin (LIPITOR) 20 MG tablet Take 1 tablet (20 mg total) by mouth daily. 30 tablet 3  . Calcium Carbonate-Vitamin D 600-200 MG-UNIT TABS Take 1 tablet by mouth daily.    . polyethylene glycol powder (GLYCOLAX/MIRALAX) 17 GM/SCOOP powder Take 17 g by mouth daily as needed for mild constipation.     . triamterene-hydrochlorothiazide (MAXZIDE-25) 37.5-25 MG tablet Take 0.5 tablets by mouth daily.      No current facility-administered medications on file prior to visit.    Allergies:  No Known Allergies    OBJECTIVE:  Physical Exam  Vitals:   08/15/19 0815  BP: 140/86  Pulse: (!) 55  Weight: 105 lb (47.6 kg)  Height: 5\' 4"  (1.626 m)   Body mass index is 18.02 kg/m. No exam data present  No flowsheet data found.   General: well developed, well nourished,  very pleasant middle-age Caucasian female, seated, in no evident distress Neck: supple with no carotid or supraclavicular bruits Cardiovascular: regular rate and rhythm, no murmurs Vascular:  Normal pulses all extremities   Neurologic Exam Mental Status: Awake and fully alert.   Fluent speech and language.  Oriented to place and time. Recent and remote memory intact. Attention span, concentration and fund of knowledge appropriate. Mood and affect appropriate.   Cranial Nerves: Fundoscopic exam reveals sharp disc margins. Pupils equal, briskly reactive to light. Extraocular movements full without nystagmus. Visual fields full to confrontation. Hearing intact. Facial sensation intact. Face, tongue, palate moves normally and symmetrically.  Motor: Normal bulk and tone. Normal strength in all tested extremity muscles. Sensory.: intact to touch , pinprick , position and vibratory sensation.  Coordination: Rapid alternating movements normal in all extremities. Finger-to-nose and heel-to-shin performed accurately bilaterally. Gait and Station: Arises from chair without difficulty. Stance is normal. Gait demonstrates normal stride length and balance Reflexes: 1+ and symmetric. Toes downgoing.     NIHSS  0  Modified Rankin  0     ASSESSMENT: SHAN PADGETT is a 64 y.o. year old female presented with transient confusion without memory of event on 07/16/2019 with confusional event atypical episode with ddx TIA (acute onset, lasted 15 to 20 minutes - no focal deficit or significant risk factors) vs TGA (exercise triggered, amnesic for episode -duration too short for TGA, did not ask repetitive questions) vs seizure (confusion, ?  Staring -no prior history of seizure, EEG normal and no typical seizure activity) vs migraine equivalent (no history of migraine, no HA reported). Vascular risk factors include HTN and HLD.      PLAN:  1. Confusion episode:  -No recurrent confusional events or episodes.  No complaints of memory loss or cognitive impairment. -Due to possible TIA, recommend continuation of aspirin 81 mg daily and atorvastatin 20 mg daily for stroke prevention -Close PCP follow up for aggressive stroke risk factor management  2. HTN: BP goal <130/90. Continue f/u with PCP 3. HLD: LDL goal <70. Recent LDL 92.  Continue atorvastatin 20 mg daily and f/u with PCP for management as well as prescribing of statin    Follow up in 6 months or call earlier  if needed   I spent 40 minutes of face-to-face and non-face-to-face time with patient.  This included previsit chart review, lab review, study review, order entry, electronic health record documentation, patient education regarding recent confusional episode and possible etiologies, importance of managing stroke risk factors and answered all questions to patient satisfaction    Ihor Austin, [facility]  [facility] [address] [place], [place] [address]  Phone [phone] Fax [phone] Note: This document was prepared with digital dictation and possible smart phrase technology. Any transcriptional errors that result from this process are unintentional.

## 2019-08-15 NOTE — Progress Notes (Signed)
I agree with the above plan 

## 2020-02-15 ENCOUNTER — Ambulatory Visit: Payer: Medicare PPO | Admitting: Adult Health

## 2020-02-15 ENCOUNTER — Encounter: Payer: Self-pay | Admitting: Adult Health

## 2020-02-15 VITALS — BP 120/82 | HR 58 | Ht 64.0 in | Wt 108.0 lb

## 2020-02-15 DIAGNOSIS — R410 Disorientation, unspecified: Secondary | ICD-10-CM

## 2020-02-15 MED ORDER — ROSUVASTATIN CALCIUM 5 MG PO TABS
5.0000 mg | ORAL_TABLET | Freq: Every day | ORAL | 3 refills | Status: AC
Start: 2020-02-15 — End: ?

## 2020-02-15 NOTE — Patient Instructions (Addendum)
For insomnia, you can trial melatonin 5-10 mg at sunset as well as ensuring good sleep hygiene  Continue aspirin 81 mg daily  for secondary stroke prevention  Due to difficulty tolerating atorvastatin, recommend trialing Crestor 5 mg daily -if you start to experience muscle aches, would recommend adding CoQ10 200mg  daily which may help relieve your muscle aches.  Continue to follow up with PCP regarding cholesterol and blood pressure management  Maintain strict control of hypertension with blood pressure goal below 130/90 and cholesterol with LDL cholesterol (bad cholesterol) goal below 70 mg/dL.    Overall stable from a neurological standpoint and recommend follow-up on an as-needed basis       Thank you for coming to see at [facility]. I hope we have been able to provide you high quality care today.  You may receive a patient satisfaction survey over the next few weeks. We would appreciate your feedback and comments so that we may continue to improve ourselves and the health of our patients.     Insomnia Insomnia is a sleep disorder that makes it difficult to fall asleep or stay asleep. Insomnia can cause fatigue, low energy, difficulty concentrating, mood swings, and poor performance at work or school. There are three different ways to classify insomnia:  Difficulty falling asleep.  Difficulty staying asleep.  Waking up too early in the morning. Any type of insomnia can be long-term (chronic) or short-term (acute). Both are common. Short-term insomnia usually lasts for three months or less. Chronic insomnia occurs at least three times a week for longer than three months. What are the causes? Insomnia may be caused by another condition, situation, or substance, such as:  Anxiety.  Certain medicines.  Gastroesophageal reflux disease (GERD) or other gastrointestinal conditions.  Asthma or other breathing conditions.  Restless legs syndrome, sleep  apnea, or other sleep disorders.  Chronic pain.  Menopause.  Stroke.  Abuse of alcohol, tobacco, or illegal drugs.  Mental health conditions, such as depression.  Caffeine.  Neurological disorders, such as Alzheimer's disease.  An overactive thyroid (hyperthyroidism). Sometimes, the cause of insomnia may not be known. What increases the risk? Risk factors for insomnia include:  Gender. Women are affected more often than men.  Age. Insomnia is more common as you get older.  Stress.  Lack of exercise.  Irregular work schedule or working night shifts.  Traveling between different time zones.  Certain medical and mental health conditions. What are the signs or symptoms? If you have insomnia, the main symptom is having trouble falling asleep or having trouble staying asleep. This may lead to other symptoms, such as:  Feeling fatigued or having low energy.  Feeling nervous about going to sleep.  Not feeling rested in the morning.  Having trouble concentrating.  Feeling irritable, anxious, or depressed. How is this diagnosed? This condition may be diagnosed based on:  Your symptoms and medical history. Your health care provider may ask about: ? Your sleep habits. ? Any medical conditions you have. ? Your mental health.  A physical exam. How is this treated? Treatment for insomnia depends on the cause. Treatment may focus on treating an underlying condition that is causing insomnia. Treatment may also include:  Medicines to help you sleep.  Counseling or therapy.  Lifestyle adjustments to help you sleep better. Follow these instructions at home: Eating and drinking  Limit or avoid alcohol, caffeinated beverages, and cigarettes, especially close to bedtime. These can disrupt your sleep.  Do not eat  a large meal or eat spicy foods right before bedtime. This can lead to digestive discomfort that can make it hard for you to sleep.   Sleep habits  Keep a  sleep diary to help you and your health care provider figure out what could be causing your insomnia. Write down: ? When you sleep. ? When you wake up during the night. ? How well you sleep. ? How rested you feel the next day. ? Any side effects of medicines you are taking. ? What you eat and drink.  Make your bedroom a dark, comfortable place where it is easy to fall asleep. ? Put up shades or blackout curtains to block light from outside. ? Use a white noise machine to block noise. ? Keep the temperature cool.  Limit screen use before bedtime. This includes: ? Watching TV. ? Using your smartphone, tablet, or computer.  Stick to a routine that includes going to bed and waking up at the same times every day and night. This can help you fall asleep faster. Consider making a quiet activity, such as reading, part of your nighttime routine.  Try to avoid taking naps during the day so that you sleep better at night.  Get out of bed if you are still awake after 15 minutes of trying to sleep. Keep the lights down, but try reading or doing a quiet activity. When you feel sleepy, go back to bed.   General instructions  Take over-the-counter and prescription medicines only as told by your health care provider.  Exercise regularly, as told by your health care provider. Avoid exercise starting several hours before bedtime.  Use relaxation techniques to manage stress. Ask your health care provider to suggest some techniques that may work well for you. These may include: ? Breathing exercises. ? Routines to release muscle tension. ? Visualizing peaceful scenes.  Make sure that you drive carefully. Avoid driving if you feel very sleepy.  Keep all follow-up visits as told by your health care provider. This is important. Contact a health care provider if:  You are tired throughout the day.  You have trouble in your daily routine due to sleepiness.  You continue to have sleep problems, or  your sleep problems get worse. Get help right away if:  You have serious thoughts about hurting yourself or someone else. If you ever feel like you may hurt yourself or others, or have thoughts about taking your own life, get help right away. You can go to your nearest emergency department or call:  Your local emergency services (911 in the [place]  A suicide crisis helpline, such as the National Suicide Prevention Lifeline at 587-373-5327. This is open 24 hours a day. Summary  Insomnia is a sleep disorder that makes it difficult to fall asleep or stay asleep.  Insomnia can be long-term (chronic) or short-term (acute).  Treatment for insomnia depends on the cause. Treatment may focus on treating an underlying condition that is causing insomnia.  Keep a sleep diary to help you and your health care provider figure out what could be causing your insomnia. This information is not intended to replace advice given to you by your health care provider. Make sure you discuss any questions you have with your health care provider. Document Revised: 11/02/2019 Document Reviewed: 11/02/2019 Elsevier Patient Education  2021 [employer].

## 2020-02-15 NOTE — Progress Notes (Signed)
I agree with the above plan 

## 2020-02-15 NOTE — Progress Notes (Signed)
[facility] [address] [place]. [place] [address] [phone]       OFFICE FOLLOW UP NOTE  [name] Date of Birth:  [birth year] Medical Record Number:  [id number]   Reason for Referral: Confusional event follow up    SUBJECTIVE:   CHIEF COMPLAINT:  Chief Complaint  Patient presents with  . Follow-up    Rm 14 alone Pt is doing very well    HPI:   Today, 02/15/2020, [name] returns for 76-month follow-up.  She has been doing well since prior visit without any additional confusional events or new stroke/TIA symptoms.  She has remained on aspirin 81 mg daily without bleeding or bruising.  Lipid panel 09/22/2019 showed LDL 47.  She is no longer on atorvastatin due to myalgias which resolved after discontinuing.  Blood pressure today initially elevated but on recheck 120/82.  Monitors at home and typically 120-130/80s.  She has been experiencing occasional insomnia but denies snoring, witnessed apnea, daytime fatigue or morning headaches.  No further concerns at this time.    History provided for reference purposes only Initial visit 08/15/2019 JM: Brianna Mccoy is being seen for hospital follow-up. Has been stable without reoccurring confusional events or episodes.  Denies new stroke/TIA symptoms Completed 3 weeks DAPT and remains on aspirin alone with mild bruising but no bleeding.  Remains on atorvastatin 20 mg daily without myalgias.  Blood pressure today 140/86 -monitors at home and typically 100-120/80s.  She continues to stay active exercising daily and working part-time as a IT sales professional. No concerns at this time  Hospital admission 07/16/2019 [name] is a 65 y.o. female with history of HTN  who presented on 07/16/2019 with transient confusion without memory of event.   Atypical confusional episode with potential Ddx including TIA vs TGA vs seizure vs migraine equivalent.  CT head and MRI brain unremarkable.  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  MRA neck unremarkable.  Recommended DAPT for 3 weeks and then aspirin alone due to potential TIA episode.  HTN stable.  LDL 92 initiate atorvastatin 20 mg daily.  No history or evidence of DM with A1c 5.3.  Other stroke risk factors include advanced age and EtOH use.  No prior stroke history.  Discharged home in stable condition without therapy needs.  Confusion episode - atypical episode, DDx including the following 1. TIA - acute onset, lasted 15-64min. However, non focal, no significant risk factors 2. TGA - exercise triggered, amnesic for the episode. However, duration is too short for TGA, did not asking repetitive questions 3. Seizure - confusion, ? Staring, but no hx of seizure, EEG normal, no typical seizure activity 4. Migraine equivalent - no hx of migraine, no HA reported  CT head No acute abnormality. Mild Atrophy.   MRI  No acute abnormality. Mild to moderate small vessel disease.   MRA head 1.5cm L A2 aneurysm.  MRA neck  Unremarkable   2D Echo EF 60-65%. No source of embolus   EEG excessive beta, no sz  LDL 92  HgbA1c 5.3   Lovenox 40 mg sq daily for VTE prophylaxis  No antithrombotic prior to admission, now on ASA 81 and plavix 75 for 3 weeks and then ASA alone.   Therapy recommendations:  No therapy needs  Disposition:  Return home       ROS:   14 system review of systems performed and negative with exception of insomnia  PMH:  Past Medical History:  Diagnosis Date  . Hypertension     PSH: History reviewed. No pertinent surgical history.  Social History:  Social History   Socioeconomic History  . Marital status: Married    Spouse name: Not on file  . Number of children: Not on file  . Years of education: Not on file  . Highest education level: Not on file  Occupational History  . Not on file  Tobacco Use  . Smoking status: Never Smoker  . Smokeless tobacco: Never Used  Substance and Sexual Activity  . Alcohol use: Yes     Comment: socail  . Drug use: Never  . Sexual activity: Not on file  Other Topics Concern  . Not on file  Social History Narrative  . Not on file   Social Determinants of Health   Financial Resource Strain: Not on file  Food Insecurity: Not on file  Transportation Needs: Not on file  Physical Activity: Not on file  Stress: Not on file  Social Connections: Not on file  Intimate Partner Violence: Not on file    Family History:  Family History  Problem Relation Age of Onset  . Diabetes Mellitus II Mother     Medications:   Current Outpatient Medications on File Prior to Visit  Medication Sig Dispense Refill  . aspirin EC 81 MG EC tablet Take 1 tablet (81 mg total) by mouth daily. Swallow whole. 30 tablet 11  . Calcium Carbonate-Vitamin D 600-200 MG-UNIT TABS Take 1 tablet by mouth daily.    Marland Kitchen triamterene-hydrochlorothiazide (MAXZIDE-25) 37.5-25 MG tablet Take 0.5 tablets by mouth as needed.     No current facility-administered medications on file prior to visit.    Allergies:   Allergies  Allergen Reactions  . Atorvastatin     Myalgias      OBJECTIVE:  Physical Exam  Vitals:   02/15/20 0736  BP: 120/82  Pulse: (!) 58  Weight: 108 lb (49 kg)  Height: 5\' 4"  (1.626 m)   Body mass index is 18.54 kg/m. No exam data present  General: well developed, well nourished, very pleasant middle-age Caucasian female, seated, in no evident distress Neck: supple with no carotid or supraclavicular bruits Cardiovascular: regular rate and rhythm, no murmurs Vascular:  Normal pulses all extremities   Neurologic Exam Mental Status: Awake and fully alert.   Fluent speech and language.  Oriented to place and time. Recent and remote memory intact. Attention span, concentration and fund of knowledge appropriate. Mood and affect appropriate.  Cranial Nerves: Pupils equal, briskly reactive to light. Extraocular movements full without nystagmus. Visual fields full to confrontation.  Hearing intact. Facial sensation intact. Face, tongue, palate moves normally and symmetrically.  Motor: Normal bulk and tone. Normal strength in all tested extremity muscles. Sensory.: intact to touch , pinprick , position and vibratory sensation.  Coordination: Rapid alternating movements normal in all extremities. Finger-to-nose and heel-to-shin performed accurately bilaterally. Gait and Station: Arises from chair without difficulty. Stance is normal. Gait demonstrates normal stride length and balance Reflexes: 1+ and symmetric. Toes downgoing.         ASSESSMENT: Brianna Mccoy is a 65 y.o. year old female presented with transient confusion without memory of event on 07/16/2019 with confusional event atypical episode with ddx TIA (acute onset, lasted 15 to 20 minutes - no focal deficit or significant risk factors) vs TGA (exercise triggered, amnesic for episode -duration too short for TGA, did not ask repetitive questions) vs seizure (confusion, ?  Staring -no prior history  of seizure, EEG normal and no typical seizure activity) vs migraine equivalent (no history of migraine, no HA reported). Vascular risk factors include HTN and HLD.      PLAN:  1. Confusion episode:  -No recurrent confusional events or episodes.  No complaints of memory loss or cognitive impairment. -Due to possible TIA, recommend continuation of aspirin 81 mg daily and atorvastatin 20 mg daily for stroke prevention -Discussed secondary stroke prevention measures and importance of close PCP follow up for aggressive stroke risk factor management  2. HTN: BP goal <130/90.  Well-controlled with use of Maxide as needed 3. HLD: LDL goal <70.  LDL 47 (09/2019).  Intolerant to atorvastatin due to myalgias.  Recommend trialing Crestor 5 mg daily and use of coq.10 200 mg daily if she experiences muscle aches/pains.  New prescription sent to pharmacy but advised to follow-up with PCP for ongoing prescribing and lipid panel  monitoring 4. Insomnia: Low suspicion for sleep apnea with STOP BANG score 1.  Discussed adequate sleep hygiene and use of melatonin.  Advised to follow-up with PCP if she continues to have difficulty    Overall stable from stroke standpoint and recommend follow-up on an as-needed basis    CC:  [facility] provider: Dr. Cristal Ford, Casimiro Needle, MD    I spent 30 minutes of face-to-face and non-face-to-face time with patient.  This included previsit chart review, lab review, study review, order entry, electronic health record documentation, patient education regarding prior confusional episode and possible etiologies, importance of managing stroke risk factors, insomnia concerns and sleep hygiene education and answered all other questions to patient satisfaction   Ihor Austin, [facility]  [facility] [address] [place], [place] [address]  Phone [phone] Fax [phone] Note: This document was prepared with digital dictation and possible smart phrase technology. Any transcriptional errors that result from this process are unintentional.

## 2023-04-09 ENCOUNTER — Encounter (HOSPITAL_BASED_OUTPATIENT_CLINIC_OR_DEPARTMENT_OTHER): Payer: Self-pay | Admitting: Emergency Medicine

## 2023-04-09 ENCOUNTER — Other Ambulatory Visit: Payer: Self-pay

## 2023-04-09 ENCOUNTER — Emergency Department (HOSPITAL_BASED_OUTPATIENT_CLINIC_OR_DEPARTMENT_OTHER)

## 2023-04-09 ENCOUNTER — Emergency Department (HOSPITAL_BASED_OUTPATIENT_CLINIC_OR_DEPARTMENT_OTHER)
Admission: EM | Admit: 2023-04-09 | Discharge: 2023-04-09 | Disposition: A | Discharge status: Home / Self Care | Attending: Emergency Medicine | Admitting: Emergency Medicine

## 2023-04-09 DIAGNOSIS — M25511 Pain in right shoulder: Secondary | ICD-10-CM | POA: Insufficient documentation

## 2023-04-09 DIAGNOSIS — S80211A Abrasion, right knee, initial encounter: Secondary | ICD-10-CM | POA: Diagnosis not present

## 2023-04-09 DIAGNOSIS — I10 Essential (primary) hypertension: Secondary | ICD-10-CM | POA: Insufficient documentation

## 2023-04-09 DIAGNOSIS — Z79899 Other long term (current) drug therapy: Secondary | ICD-10-CM | POA: Diagnosis not present

## 2023-04-09 DIAGNOSIS — S0083XA Contusion of other part of head, initial encounter: Secondary | ICD-10-CM

## 2023-04-09 DIAGNOSIS — Y9302 Activity, running: Secondary | ICD-10-CM | POA: Diagnosis not present

## 2023-04-09 DIAGNOSIS — S0990XA Unspecified injury of head, initial encounter: Secondary | ICD-10-CM | POA: Insufficient documentation

## 2023-04-09 DIAGNOSIS — S01511A Laceration without foreign body of lip, initial encounter: Secondary | ICD-10-CM

## 2023-04-09 DIAGNOSIS — R001 Bradycardia, unspecified: Secondary | ICD-10-CM | POA: Diagnosis not present

## 2023-04-09 DIAGNOSIS — Z23 Encounter for immunization: Secondary | ICD-10-CM | POA: Diagnosis not present

## 2023-04-09 DIAGNOSIS — Z7982 Long term (current) use of aspirin: Secondary | ICD-10-CM | POA: Diagnosis not present

## 2023-04-09 DIAGNOSIS — W19XXXA Unspecified fall, initial encounter: Secondary | ICD-10-CM

## 2023-04-09 DIAGNOSIS — W01198A Fall on same level from slipping, tripping and stumbling with subsequent striking against other object, initial encounter: Secondary | ICD-10-CM | POA: Insufficient documentation

## 2023-04-09 DIAGNOSIS — S0993XA Unspecified injury of face, initial encounter: Secondary | ICD-10-CM | POA: Diagnosis present

## 2023-04-09 DIAGNOSIS — S61411A Laceration without foreign body of right hand, initial encounter: Secondary | ICD-10-CM | POA: Diagnosis not present

## 2023-04-09 MED ORDER — TETANUS-DIPHTH-ACELL PERTUSSIS 5-2.5-18.5 LF-MCG/0.5 IM SUSY
0.5000 mL | PREFILLED_SYRINGE | Freq: Once | INTRAMUSCULAR | Status: AC
  Administered 2023-04-09: 0.5 mL via INTRAMUSCULAR
  Filled 2023-04-09: qty 0.5

## 2023-04-09 MED ORDER — BACITRACIN ZINC 500 UNIT/GM EX OINT: TOPICAL_OINTMENT | Freq: Two times a day (BID) | CUTANEOUS | Status: DC

## 2023-04-09 MED ORDER — CEPHALEXIN 500 MG PO CAPS: 500.0000 mg | ORAL_CAPSULE | Freq: Three times a day (TID) | ORAL | 0 refills | Status: AC

## 2023-04-09 MED ORDER — LIDOCAINE HCL (PF) 1 % IJ SOLN
30.0000 mL | Freq: Once | INTRAMUSCULAR | Status: DC
  Filled 2023-04-09: qty 30

## 2023-04-09 NOTE — ED Provider Notes (Signed)
 [place] [facility] Provider Note   CSN: [id number] Arrival date & time: 04/09/23  0850     History  Chief Complaint  Patient presents with   Fall    Liah VENICE MARCUCCI is a 68 y.o. female.  68 year old female presents today for concern of fall.  Patient states that she was on her routine morning run when she lost her footing and fell forward.  She states she struck her face.  She tried to break her fall with her hands but the hand struck the sidewalk causing a laceration to the palm of her hand.  Denies any loss of consciousness.  She is unsure of her last tetanus shot.  She states her right shoulder is sore but no significant pain and has good range of motion.  She is not nauseous and has not had any episodes of emesis.  No additional complaints.  She takes a baby aspirin otherwise no anticoagulation.  The history is provided by the patient. No language interpreter was used.       Home Medications Prior to Admission medications   Medication Sig Start Date End Date Taking? Authorizing Provider  aspirin EC 81 MG EC tablet Take 1 tablet (81 mg total) by mouth daily. Swallow whole. 07/17/19   Amin, Ankit C, MD  Calcium Carbonate-Vitamin D 600-200 MG-UNIT TABS Take 1 tablet by mouth daily.    [provider]  rosuvastatin (CRESTOR) 5 MG tablet Take 1 tablet (5 mg total) by mouth daily. 02/15/20   Ihor Austin, NP  triamterene-hydrochlorothiazide (MAXZIDE-25) 37.5-25 MG tablet Take 0.5 tablets by mouth as needed. 08/07/16   [provider]      Allergies    Atorvastatin    Review of Systems   Review of Systems  Eyes:  Negative for visual disturbance.  Musculoskeletal:  Negative for neck pain.  Skin:  Positive for wound.  Neurological:  Negative for syncope.  All other systems reviewed and are negative.   Physical Exam Updated Vital Signs BP (!) 147/94 (BP Location: Right Arm)   Pulse (!) 53   Temp 98.6 F (37 C) (Oral)    Resp 15   Ht 5\' 4"  (1.626 m)   Wt 49.9 kg   SpO2 97%   BMI 18.88 kg/m  Physical Exam Vitals and nursing note reviewed.  Constitutional:      General: She is not in acute distress.    Appearance: Normal appearance. She is not ill-appearing.  HENT:     Head: Normocephalic and atraumatic.     Nose: Nose normal.  Eyes:     Conjunctiva/sclera: Conjunctivae normal.  Cardiovascular:     Rate and Rhythm: Regular rhythm. Bradycardia present.  Pulmonary:     Effort: Pulmonary effort is normal. No respiratory distress.  Abdominal:     General: There is no distension.     Palpations: Abdomen is soft.     Tenderness: There is no abdominal tenderness. There is no guarding.  Musculoskeletal:        General: No deformity. Normal range of motion.     Cervical back: Normal range of motion. No rigidity or tenderness.     Comments: Cervical, thoracic, lumbar spine without tenderness to palpation.  Spine well aligned.  Good range of motion in bilateral upper and lower extremities with 5/5 strength in extensor and flexor muscle groups.  There is a laceration noted to the right palm.  Extends about 7 cm.  Bleeding controlled.  Appears fairly  superficial.  Good range of motion in all fingers of the right hand.  Sensation intact.  Abrasion noted to right knee.  Superficial and does not require repair.  Also laceration along the nailbed of the right index finger.  Nail is intact.  Neurovascularly intact in bilateral upper extremities.  All major joints with good range of motion without tenderness to palpation.  Skin:    Findings: No rash.  Neurological:     Mental Status: She is alert.     ED Results / Procedures / Treatments   Labs (all labs ordered are listed, but only abnormal results are displayed) Labs Reviewed - No data to display  EKG None  Radiology No results found.  Procedures .Marland KitchenLac repair Aalina Brege  Date/Time: 04/09/2023 11:14 AM  Performed by: Marita Kansas, PA-C Authorized by: Marita Kansas, PA-C   Consent:    Consent obtained:  Verbal   Consent given by:  Patient   Risks discussed:  Need for additional repair, infection, retained foreign body, poor cosmetic result and poor wound healing   Alternatives discussed:  No treatment Universal protocol:    Procedure explained and questions answered to patient or proxy's satisfaction: yes     Relevant documents present and verified: yes     Patient identity confirmed:  Verbally with patient and arm band Laceration details:    Location:  Lip   Lip location:  Upper exterior lip   Length (cm):  0.5 Pre-procedure details:    Preparation:  Patient was prepped and draped in usual sterile fashion Treatment:    Area cleansed with:  Saline and povidone-iodine   Amount of cleaning:  Extensive   Irrigation solution:  Sterile saline   Irrigation volume:  500   Irrigation method:  Tap   Debridement:  None   Undermining:  None Skin repair:    Repair method:  Sutures   Suture size:  5-0   Suture material:  Prolene   Suture technique:  Simple interrupted   Number of sutures:  1 Approximation:    Approximation:  Close   Vermilion border well-aligned: yes   Repair type:    Repair type:  Simple Post-procedure details:    Dressing:  Non-adherent dressing   Procedure completion:  Tolerated well, no immediate complications Comments:     0.5 cm laceration that crosses the vermilion border.  1 stitch placed to approximate the lip at the vermilion border. This was not a through and through laceration. Marland Kitchen.Lac repair Aliene Tamura  Date/Time: 04/09/2023 11:15 AM  Performed by: Marita Kansas, PA-C Authorized by: Marita Kansas, PA-C   Consent:    Consent obtained:  Verbal   Consent given by:  Patient   Risks discussed:  Need for additional repair, infection, retained foreign body, poor cosmetic result and poor wound healing   Alternatives discussed:  No treatment Universal protocol:    Procedure explained and questions answered to patient or  proxy's satisfaction: yes     Relevant documents present and verified: yes     Patient identity confirmed:  Verbally with patient and arm band Laceration details:    Location:  Lip   Lip location:  Upper interior lip   Length (cm):  1 Pre-procedure details:    Preparation:  Patient was prepped and draped in usual sterile fashion Treatment:    Area cleansed with:  Saline and povidone-iodine   Amount of cleaning:  Extensive   Irrigation solution:  Sterile saline   Irrigation volume:  500   Irrigation  method:  Tap   Debridement:  None   Undermining:  None Skin repair:    Repair method:  Sutures   Suture size:  5-0   Suture material:  Prolene and fast-absorbing gut   Suture technique:  Simple interrupted   Number of sutures:  3 Approximation:    Approximation:  Close Repair type:    Repair type:  Simple Post-procedure details:    Dressing:  Non-adherent dressing   Procedure completion:  Tolerated well, no immediate complications .Marland KitchenLac repair Khai Torbert  Date/Time: 04/09/2023 11:16 AM  Performed by: Marita Kansas, PA-C Authorized by: Marita Kansas, PA-C   Consent:    Consent obtained:  Verbal   Consent given by:  Patient   Risks discussed:  Need for additional repair, infection, retained foreign body, poor cosmetic result and poor wound healing   Alternatives discussed:  No treatment Universal protocol:    Procedure explained and questions answered to patient or proxy's satisfaction: yes     Relevant documents present and verified: yes     Patient identity confirmed:  Verbally with patient and arm band Laceration details:    Location:  Hand   Hand location:  R palm   Length (cm):  7 Pre-procedure details:    Preparation:  Patient was prepped and draped in usual sterile fashion Treatment:    Area cleansed with:  Saline and povidone-iodine   Amount of cleaning:  Extensive   Irrigation solution:  Sterile saline   Irrigation volume:  500   Irrigation method:  Tap   Debridement:   None   Undermining:  None Skin repair:    Repair method:  Sutures   Suture size:  5-0   Suture material:  Prolene   Suture technique:  Simple interrupted   Number of sutures:  8 Approximation:    Approximation:  Close Repair type:    Repair type:  Simple Post-procedure details:    Dressing:  Non-adherent dressing   Procedure completion:  Tolerated well, no immediate complications .Marland KitchenLac repair Isidoro Santillana  Date/Time: 04/09/2023 11:17 AM  Performed by: Marita Kansas, PA-C Authorized by: Marita Kansas, PA-C   Consent:    Consent obtained:  Verbal   Consent given by:  Patient   Risks discussed:  Need for additional repair, infection, retained foreign body, poor cosmetic result and poor wound healing   Alternatives discussed:  No treatment Universal protocol:    Procedure explained and questions answered to patient or proxy's satisfaction: yes     Relevant documents present and verified: yes     Patient identity confirmed:  Verbally with patient and arm band Laceration details:    Length (cm):  0.4 Pre-procedure details:    Preparation:  Patient was prepped and draped in usual sterile fashion Treatment:    Area cleansed with:  Saline and povidone-iodine   Amount of cleaning:  Standard   Irrigation solution:  Sterile saline   Irrigation volume:  250   Irrigation method:  Tap   Debridement:  None   Undermining:  None Skin repair:    Repair method:  Tissue adhesive Approximation:    Approximation:  Close Repair type:    Repair type:  Simple Post-procedure details:    Dressing:  Non-adherent dressing   Procedure completion:  Tolerated well, no immediate complications     Medications Ordered in ED Medications  Tdap (BOOSTRIX) injection 0.5 mL (has no administration in time range)  lidocaine (PF) (XYLOCAINE) 1 % injection 30 mL (has no administration in time range)  ED Course/ Medical Decision Making/ A&P                                 Medical Decision Making Amount and/or  Complexity of Data Reviewed Radiology: ordered.  Risk OTC drugs. Prescription drug management.   Medical Decision Making / ED Course   This patient presents to the ED for concern of fall, this involves an extensive number of treatment options, and is a complaint that carries with it a high risk of complications and morbidity.  The differential diagnosis includes acute intracranial injury, concussion, laceration, fracture  MDM: 68 year old female presents today for concern of fall.  She has a laceration to the right palm which measures 7 cm, has a laceration to the upper lip that does cross the vermilion border, laceration on the inside of the upper lip.  She has an abrasion to the right knee, and a small laceration surrounding the right index finger nailbed.  Patient is right-hand dominant.  The fingernail is attached pretty well to the nailbed.  Do not feel we need to remove the nail to evaluate the nailbed.  Patient is in agreement with this.  This was irrigated well and closed with Dermabond.  Other 3 lacerations were repaired with sutures.  See the procedure note above.  Right hand, right wrist without any tenderness to palpation.  Good range of motion.  Neurovascularly intact.  No tenderness to palpation over the anatomical snuffbox.  Wound was copiously irrigated.  We did discuss obtaining the x-ray to the right hand but patient deferred and stated if she develops any symptoms she will follow-up or return.  I feel this is reasonable.  CT without acute intracranial process.  Does show hematoma which is visible on exam as well.  Patient discharged in stable condition.  Return precaution discussed.  Concussion symptoms and return precautions discussed.  At this time she does not have any signs or symptoms of a concussion.     Additional history obtained: -Additional history obtained from husband who was at bedside -External records from outside source obtained and reviewed including:  Chart review including previous notes, labs, imaging, consultation notes   Lab Tests: -I ordered, reviewed, and interpreted labs.   The pertinent results include:   Labs Reviewed - No data to display    EKG  EKG Interpretation Date/Time:    Ventricular Rate:    PR Interval:    QRS Duration:    QT Interval:    QTC Calculation:   R Axis:      Text Interpretation:           Imaging Studies ordered: I ordered imaging studies including CT head without contrast I independently visualized and interpreted imaging. I agree with the radiologist interpretation   Medicines ordered and prescription drug management: Meds ordered this encounter  Medications   Tdap (BOOSTRIX) injection 0.5 mL   lidocaine (PF) (XYLOCAINE) 1 % injection 30 mL   bacitracin ointment   cephALEXin (KEFLEX) 500 MG capsule    Sig: Take 1 capsule (500 mg total) by mouth 3 (three) times daily for 7 days.    Dispense:  21 capsule    Refill:  0    Supervising Provider:   Eber Hong [[id number]]    -I have reviewed the patients home medicines and have made adjustments as needed  Social Determinants of Health:  Factors impacting patients care include: Good social support and good  follow-up   Reevaluation: After the interventions noted above, I reevaluated the patient and found that they have :improved  Co morbidities that complicate the patient evaluation  Past Medical History:  Diagnosis Date   Hypertension       Dispostion: Discharged in stable condition.  Return precaution discussed.  Admission considered but at this time patient would benefit from discharge and outpatient follow-up.   Final Clinical Impression(s) / ED Diagnoses Final diagnoses:  Fall, initial encounter  Lip laceration, initial encounter  Laceration of vermilion border of upper lip without complication, initial encounter  Laceration of right palm, initial encounter  Traumatic hematoma of forehead, initial encounter    Rx /  DC Orders ED Discharge Orders          Ordered    cephALEXin (KEFLEX) 500 MG capsule  3 times daily        04/09/23 1052              Marita Kansas, PA-C 04/09/23 1118    Long, Arlyss Repress, MD 04/12/23 [id number]

## 2023-04-09 NOTE — ED Triage Notes (Addendum)
 Pt states was running this am and fell hit her face has lac to upper and lower lip rt palm of hand has abrasions an drt rt knee ,  has bump  forehead over rt eye rt shoulder is sore she states  no LOC no slurred speech and is walking normal , pt states she took shower before coming  UNK last tetanus

## 2023-04-09 NOTE — Discharge Instructions (Signed)
 Your workup today was overall reassuring.  CT scan did not show any concerning findings.  Your lacerations were repaired with stitches.  These will need to come out in 7 days.  With the exception of the 3 stitches that are on the inside of the upper lip.  These are dissolvable and will dissolve in the next 4 to 5 days.  We also placed glue around the nailbed.  There was a small laceration there.  However your nail was intact to the nailbed.  If you notice any concerning signs or symptoms please return to the emergency room.  Specially if you notice any signs of infection such as worsening redness, drainage, limited range of motion, or fever without any other source. You might also have a concussion especially given the significant head injury.  It is normal to have a headache when you are focusing on a task but improves if you stop the activity or rest in a dark lit room.  Occasional nausea and vomiting is normal but persistent nausea and vomiting is concerning.  Vision changes concerning.  If you notice these please return for evaluation.  Your symptoms should not last longer than a week.  If they do follow-up with your primary care doctor.

## 2023-04-09 NOTE — ED Notes (Signed)
 Patient wound covered with bacitracin and non stick pad bandage per order. Pt tolerated well.

## 2023-10-07 ENCOUNTER — Other Ambulatory Visit (HOSPITAL_BASED_OUTPATIENT_CLINIC_OR_DEPARTMENT_OTHER): Payer: Self-pay

## 2023-10-07 MED ORDER — TRIAMTERENE-HCTZ 37.5-25 MG PO TABS
1.0000 | ORAL_TABLET | Freq: Every day | ORAL | 1 refills | Status: DC
  Filled 2023-10-07 – 2023-11-05 (×2): qty 90, 90d supply, fill #0

## 2023-10-07 MED ORDER — ROSUVASTATIN CALCIUM 5 MG PO TABS
5.0000 mg | ORAL_TABLET | Freq: Every day | ORAL | 1 refills | Status: DC
  Filled 2023-10-07 – 2023-11-26 (×2): qty 90, 90d supply, fill #0

## 2023-11-05 ENCOUNTER — Other Ambulatory Visit (HOSPITAL_BASED_OUTPATIENT_CLINIC_OR_DEPARTMENT_OTHER): Payer: Self-pay

## 2023-11-26 ENCOUNTER — Other Ambulatory Visit (HOSPITAL_BASED_OUTPATIENT_CLINIC_OR_DEPARTMENT_OTHER): Payer: Self-pay

## 2024-01-19 ENCOUNTER — Other Ambulatory Visit (HOSPITAL_BASED_OUTPATIENT_CLINIC_OR_DEPARTMENT_OTHER): Payer: Self-pay

## 2024-01-19 ENCOUNTER — Other Ambulatory Visit: Payer: Self-pay

## 2024-01-19 MED ORDER — TRIAMTERENE-HCTZ 37.5-25 MG PO TABS
1.0000 | ORAL_TABLET | Freq: Every day | ORAL | 1 refills | Status: AC
  Filled 2024-01-19: qty 90, 90d supply, fill #0

## 2024-01-19 MED ORDER — ROSUVASTATIN CALCIUM 5 MG PO TABS: 5.0000 mg | ORAL_TABLET | Freq: Every day | ORAL | 1 refills | Status: AC
